# Patient Record
Sex: Female | Born: 1940 | Race: White | Hispanic: No | Smoking: Former smoker
Health system: Southern US, Community
[De-identification: ages and names within clinical notes are randomized; demographics above are authoritative.]

## PROBLEM LIST (undated history)

## (undated) DIAGNOSIS — E079 Disorder of thyroid, unspecified: Secondary | ICD-10-CM

## (undated) DIAGNOSIS — F419 Anxiety disorder, unspecified: Secondary | ICD-10-CM

## (undated) DIAGNOSIS — S72009A Fracture of unspecified part of neck of unspecified femur, initial encounter for closed fracture: Secondary | ICD-10-CM

## (undated) DIAGNOSIS — M1990 Unspecified osteoarthritis, unspecified site: Secondary | ICD-10-CM

## (undated) DIAGNOSIS — M810 Age-related osteoporosis without current pathological fracture: Secondary | ICD-10-CM

## (undated) HISTORY — PX: ANKLE FRACTURE SURGERY: SHX122

## (undated) HISTORY — PX: THYROID SURGERY: SHX805

## (undated) HISTORY — PX: DILATION AND CURETTAGE OF UTERUS: SHX78

---

## 2016-09-20 ENCOUNTER — Emergency Department (HOSPITAL_BASED_OUTPATIENT_CLINIC_OR_DEPARTMENT_OTHER)
Admission: EM | Admit: 2016-09-20 | Discharge: 2016-09-20 | Disposition: A | Discharge status: Home / Self Care | Payer: Medicare Other | Attending: Emergency Medicine | Admitting: Emergency Medicine

## 2016-09-20 ENCOUNTER — Encounter (HOSPITAL_BASED_OUTPATIENT_CLINIC_OR_DEPARTMENT_OTHER): Payer: Self-pay

## 2016-09-20 DIAGNOSIS — Z87891 Personal history of nicotine dependence: Secondary | ICD-10-CM | POA: Insufficient documentation

## 2016-09-20 DIAGNOSIS — W268XXA Contact with other sharp object(s), not elsewhere classified, initial encounter: Secondary | ICD-10-CM | POA: Insufficient documentation

## 2016-09-20 DIAGNOSIS — S6991XA Unspecified injury of right wrist, hand and finger(s), initial encounter: Secondary | ICD-10-CM | POA: Diagnosis present

## 2016-09-20 DIAGNOSIS — Z79899 Other long term (current) drug therapy: Secondary | ICD-10-CM | POA: Insufficient documentation

## 2016-09-20 DIAGNOSIS — Z23 Encounter for immunization: Secondary | ICD-10-CM | POA: Diagnosis not present

## 2016-09-20 DIAGNOSIS — Y999 Unspecified external cause status: Secondary | ICD-10-CM | POA: Insufficient documentation

## 2016-09-20 DIAGNOSIS — S61216A Laceration without foreign body of right little finger without damage to nail, initial encounter: Secondary | ICD-10-CM | POA: Insufficient documentation

## 2016-09-20 DIAGNOSIS — Y929 Unspecified place or not applicable: Secondary | ICD-10-CM | POA: Insufficient documentation

## 2016-09-20 DIAGNOSIS — Y939 Activity, unspecified: Secondary | ICD-10-CM | POA: Insufficient documentation

## 2016-09-20 HISTORY — DX: Age-related osteoporosis without current pathological fracture: M81.0

## 2016-09-20 HISTORY — DX: Disorder of thyroid, unspecified: E07.9

## 2016-09-20 HISTORY — DX: Anxiety disorder, unspecified: F41.9

## 2016-09-20 MED ORDER — TETANUS-DIPHTH-ACELL PERTUSSIS 5-2.5-18.5 LF-MCG/0.5 IM SUSP
0.5000 mL | Freq: Once | INTRAMUSCULAR | Status: AC
  Administered 2016-09-20: 0.5 mL via INTRAMUSCULAR
  Filled 2016-09-20: qty 0.5

## 2016-09-20 NOTE — ED Provider Notes (Signed)
[facility] Provider Note   CSN: [id number] Arrival date & time: 09/20/16  2041   By signing my name below, I, Soijett Blue, attest that this documentation has been prepared under the direction and in the presence of Jaynie Crumbleatyana Isadore Bokhari, [employer] CorporationPA-C Electronically Signed: Soijett Blue, ED Scribe. 09/20/16. 9:24 PM.  History   Chief Complaint Chief Complaint  Patient presents with  . Finger Injury    HPI Julie Hayden is a 76 y.o. female who presents to the Emergency Department complaining of right pinky finger injury occurring PTA. Pt reports associated laceration to right pinky finger and right pinky finger pain. Pt has tried applying pressure without medications with mild relief of her symptoms. She notes that she cut her right pinky finger on the base of her metal lamp. Pt is unsure of the status of her tetanus vaccination. She denies swelling, color change, and any other symptoms.    The history is provided by the patient and the spouse. No language interpreter was used.    Past Medical History:  Diagnosis Date  . Anxiety   . Osteoporosis   . Thyroid disease    goiter    There are no active problems to display for this patient.   Past Surgical History:  Procedure Laterality Date  . ANKLE FRACTURE SURGERY    . CESAREAN SECTION    . THYROID SURGERY      OB History    No data available       Home Medications    Prior to Admission medications   Medication Sig Start Date End Date Taking? Authorizing Provider  cholecalciferol (VITAMIN D) 1000 units tablet Take 4,000 Units by mouth daily.   Yes [provider]  sertraline (ZOLOFT) 50 MG tablet Take 50 mg by mouth daily.   Yes [provider]    Family History No family history on file.  Social History Social History  Substance Use Topics  . Smoking status: Former Smoker    Quit date: 09/21/1978  . Smokeless tobacco: Never Used  . Alcohol use 4.2 oz/week    7 Glasses of wine per week       Allergies   Patient has no known allergies.   Review of Systems Review of Systems  Musculoskeletal: Positive for arthralgias (right pinky finger). Negative for joint swelling.  Skin: Positive for wound (laceration to right pinky finger). Negative for color change.  Neurological: Negative for weakness and numbness.  Hematological: Does not bruise/bleed easily.     Physical Exam Updated Vital Signs BP (!) 162/94   Pulse (!) 117   Temp 99.1 F (37.3 C) (Oral)   Resp (!) 21   Ht 5\' 2"  (1.575 m)   Wt 144 lb (65.3 kg)   SpO2 97%   BMI 26.34 kg/m   Physical Exam  Constitutional: She is oriented to person, place, and time. She appears well-developed and well-nourished. No distress.  HENT:  Head: Normocephalic and atraumatic.  Eyes: EOM are normal.  Neck: Neck supple.  Cardiovascular: Normal rate.   Pulmonary/Chest: Effort normal. No respiratory distress.  Abdominal: She exhibits no distension.  Musculoskeletal: Normal range of motion.  2cm laceration to the ulnar aspect of the right little finger over middle phalanx. Hemostatic. Cap refill <2sec distally. Sensation intact distally. Full ROM and normal strength with flexion and extension at each isolated joint.   Neurological: She is alert and oriented to person, place, and time.  Skin: Skin is warm and dry.  Psychiatric: She has  a normal mood and affect. Her behavior is normal.  Nursing note and vitals reviewed.    ED Treatments / Results  DIAGNOSTIC STUDIES: Oxygen Saturation is 97% on RA, nl by my interpretation.    COORDINATION OF CARE: 9:34 PM Discussed treatment plan with pt at bedside which includes laceration repair, and pt agreed to plan.   Labs (all labs ordered are listed, but only abnormal results are displayed) Labs Reviewed - No data to display  EKG  EKG Interpretation None       Radiology No results found.  Procedures .Marland KitchenLaceration Repair Date/Time: 09/20/2016 10:13 PM Performed by:  Jaynie Crumble Authorized by: Jaynie Crumble   Consent:    Consent obtained:  Verbal   Consent given by:  Patient   Risks discussed:  Pain and need for additional repair Anesthesia (see MAR for exact dosages):    Anesthesia method:  None Laceration details:    Location:  Finger   Finger location:  R small finger   Length (cm):  2 Repair type:    Repair type:  Simple Pre-procedure details:    Preparation:  Patient was prepped and draped in usual sterile fashion Exploration:    Hemostasis achieved with:  Direct pressure   Wound exploration: wound explored through full range of motion     Wound extent: no foreign bodies/material noted     Contaminated: no   Treatment:    Area cleansed with:  Saline   Amount of cleaning:  Standard   Irrigation solution:  Sterile saline   Irrigation method:  Syringe   Visualized foreign bodies/material removed: no   Skin repair:    Repair method:  Tissue adhesive (dermabond) Approximation:    Approximation:  Close Post-procedure details:    Dressing:  Open (no dressing)   Patient tolerance of procedure:  Tolerated well, no immediate complications   (including critical care time)    Medications Ordered in ED Medications - No data to display   Initial Impression / Assessment and Plan / ED Course  I have reviewed the triage vital signs and the nursing notes.  Pertinent labs & imaging results that were available during my care of the patient were reviewed by me and considered in my medical decision making (see chart for details).     Patient in the emergency department with laceration to the right fifth finger, hemostatic at this time, does not appear to involve any tendons, vessels, major nerves. Wound irrigated with saline. Repaired with Dermabond with good closure. Tetanus updated. Initially patient is very anxious, she is tachycardic, hypertensive, vital signs improved with reevaluation. Patient has no other complaints. She is  stable for discharge home at this time, she was given a splint to prevent flexion of the finger due to laceration extending just past the DIP joint. Wound care discussed. Follow-up with family doctor as needed.   Vitals:   09/20/16 2053 09/20/16 2100 09/20/16 2204 09/20/16 2212  BP: (!) 162/94 (!) 162/94 129/70 129/62  Pulse: (!) 117 (!) 117 (!) 104 99  Resp: (!) 21 (!) 21 16 14   Temp: 99.1 F (37.3 C) 99.1 F (37.3 C) 98.4 F (36.9 C)   TempSrc: Oral Oral Oral   SpO2: 97% 97% 96% 95%  Weight:      Height:         Final Clinical Impressions(s) / ED Diagnoses   Final diagnoses:  Laceration of right little finger without foreign body without damage to nail, initial encounter    New  Prescriptions New Prescriptions   No medications on file    I personally performed the services described in this documentation, which was scribed in my presence. The recorded information has been reviewed and is accurate.    Jaynie Crumble, PA-C 09/20/16 2220    Maia Plan, MD 09/21/16 1055

## 2016-09-20 NOTE — ED Triage Notes (Signed)
Pt reports laceration to R pinky finger. Dressing in place on arrival.

## 2016-09-20 NOTE — ED Notes (Signed)
Pt discharged to home with family. NAD.  

## 2016-09-20 NOTE — ED Notes (Signed)
PMS intact before and after. Pt tolerated well. All questions answered. 

## 2016-09-20 NOTE — Discharge Instructions (Signed)
Wound care as discussed in the print out. Keep clean, dry. Use finger splint for the next 3-5 days. Follow up with family doctor as needed.

## 2016-09-20 NOTE — ED Notes (Signed)
ED Provider at bedside. 

## 2017-05-26 ENCOUNTER — Emergency Department (HOSPITAL_COMMUNITY): Payer: Medicare Other

## 2017-05-26 ENCOUNTER — Other Ambulatory Visit: Payer: Self-pay

## 2017-05-26 ENCOUNTER — Inpatient Hospital Stay (HOSPITAL_COMMUNITY)
Admission: EM | Admit: 2017-05-26 | Discharge: 2017-05-31 | DRG: 482 | Disposition: A | Discharge status: Home Health | Payer: Medicare Other | Attending: Internal Medicine | Admitting: Internal Medicine

## 2017-05-26 ENCOUNTER — Encounter (HOSPITAL_COMMUNITY): Payer: Self-pay

## 2017-05-26 DIAGNOSIS — M1612 Unilateral primary osteoarthritis, left hip: Secondary | ICD-10-CM | POA: Diagnosis present

## 2017-05-26 DIAGNOSIS — Z87891 Personal history of nicotine dependence: Secondary | ICD-10-CM | POA: Diagnosis not present

## 2017-05-26 DIAGNOSIS — W010XXA Fall on same level from slipping, tripping and stumbling without subsequent striking against object, initial encounter: Secondary | ICD-10-CM | POA: Diagnosis present

## 2017-05-26 DIAGNOSIS — R000 Tachycardia, unspecified: Secondary | ICD-10-CM | POA: Diagnosis present

## 2017-05-26 DIAGNOSIS — M810 Age-related osteoporosis without current pathological fracture: Secondary | ICD-10-CM | POA: Diagnosis present

## 2017-05-26 DIAGNOSIS — S72009A Fracture of unspecified part of neck of unspecified femur, initial encounter for closed fracture: Secondary | ICD-10-CM | POA: Diagnosis present

## 2017-05-26 DIAGNOSIS — F419 Anxiety disorder, unspecified: Secondary | ICD-10-CM

## 2017-05-26 DIAGNOSIS — Z79899 Other long term (current) drug therapy: Secondary | ICD-10-CM

## 2017-05-26 DIAGNOSIS — E876 Hypokalemia: Secondary | ICD-10-CM | POA: Diagnosis present

## 2017-05-26 DIAGNOSIS — M1990 Unspecified osteoarthritis, unspecified site: Secondary | ICD-10-CM

## 2017-05-26 DIAGNOSIS — Y92512 Supermarket, store or market as the place of occurrence of the external cause: Secondary | ICD-10-CM

## 2017-05-26 DIAGNOSIS — D72829 Elevated white blood cell count, unspecified: Secondary | ICD-10-CM | POA: Diagnosis present

## 2017-05-26 DIAGNOSIS — R52 Pain, unspecified: Secondary | ICD-10-CM

## 2017-05-26 DIAGNOSIS — Z01818 Encounter for other preprocedural examination: Secondary | ICD-10-CM

## 2017-05-26 DIAGNOSIS — Z419 Encounter for procedure for purposes other than remedying health state, unspecified: Secondary | ICD-10-CM

## 2017-05-26 DIAGNOSIS — S72145A Nondisplaced intertrochanteric fracture of left femur, initial encounter for closed fracture: Secondary | ICD-10-CM | POA: Diagnosis present

## 2017-05-26 HISTORY — DX: Fracture of unspecified part of neck of unspecified femur, initial encounter for closed fracture: S72.009A

## 2017-05-26 HISTORY — DX: Unspecified osteoarthritis, unspecified site: M19.90

## 2017-05-26 LAB — BASIC METABOLIC PANEL
Anion gap: 11 (ref 5–15)
BUN: 18 mg/dL (ref 6–20)
CHLORIDE: 106 mmol/L (ref 101–111)
CO2: 25 mmol/L (ref 22–32)
Calcium: 9.3 mg/dL (ref 8.9–10.3)
Creatinine, Ser: 0.68 mg/dL (ref 0.44–1.00)
GFR calc Af Amer: >60 mL/min (ref >60)
GFR calc non Af Amer: >60 mL/min (ref >60)
Glucose, Bld: 128 mg/dL — ABNORMAL HIGH (ref 65–99)
POTASSIUM: 3.4 mmol/L — AB (ref 3.5–5.1)
SODIUM: 142 mmol/L (ref 135–145)

## 2017-05-26 LAB — CBC WITH DIFFERENTIAL/PLATELET
BASOS ABS: 0.1 10*3/uL (ref 0.0–0.1)
Basophils Relative: 0 %
EOS ABS: 0 10*3/uL (ref 0.0–0.7)
Eosinophils Relative: 0 %
HCT: 38.2 % (ref 36.0–46.0)
HEMOGLOBIN: 13.1 g/dL (ref 12.0–15.0)
Lymphocytes Relative: 10 %
Lymphs Abs: 1.7 10*3/uL (ref 0.7–4.0)
MCH: 32 pg (ref 26.0–34.0)
MCHC: 34.3 g/dL (ref 30.0–36.0)
MCV: 93.2 fL (ref 78.0–100.0)
Monocytes Absolute: 0.8 10*3/uL (ref 0.1–1.0)
Monocytes Relative: 5 %
NEUTROS PCT: 85 %
Neutro Abs: 14.7 10*3/uL — ABNORMAL HIGH (ref 1.7–7.7)
Platelets: 406 10*3/uL — ABNORMAL HIGH (ref 150–400)
RBC: 4.1 MIL/uL (ref 3.87–5.11)
RDW: 12.9 % (ref 11.5–15.5)
WBC: 17.3 10*3/uL — AB (ref 4.0–10.5)

## 2017-05-26 MED ORDER — MORPHINE SULFATE (PF) 4 MG/ML IV SOLN
4.0000 mg | Freq: Once | INTRAVENOUS | Status: AC
  Administered 2017-05-26: 4 mg via INTRAVENOUS
  Filled 2017-05-26: qty 1

## 2017-05-26 MED ORDER — ENOXAPARIN SODIUM 40 MG/0.4ML ~~LOC~~ SOLN
40.0000 mg | SUBCUTANEOUS | Status: DC
  Administered 2017-05-27: 10:00:00 40 mg via SUBCUTANEOUS
  Filled 2017-05-26: qty 0.4

## 2017-05-26 MED ORDER — ACETAMINOPHEN 325 MG PO TABS
650.0000 mg | ORAL_TABLET | Freq: Four times a day (QID) | ORAL | Status: DC | PRN
  Administered 2017-05-27 – 2017-05-29 (×3): 650 mg via ORAL
  Filled 2017-05-26 (×3): qty 2

## 2017-05-26 MED ORDER — SODIUM CHLORIDE 0.9% FLUSH
3.0000 mL | Freq: Two times a day (BID) | INTRAVENOUS | Status: DC
  Administered 2017-05-26 – 2017-05-31 (×7): 3 mL via INTRAVENOUS

## 2017-05-26 MED ORDER — ONDANSETRON HCL 4 MG/2ML IJ SOLN: 4.0000 mg | Freq: Four times a day (QID) | INTRAMUSCULAR | Status: DC | PRN

## 2017-05-26 MED ORDER — POLYETHYLENE GLYCOL 3350 17 G PO PACK
17.0000 g | PACK | Freq: Every day | ORAL | Status: DC | PRN
  Administered 2017-05-30: 09:00:00 17 g via ORAL
  Filled 2017-05-26: qty 1

## 2017-05-26 MED ORDER — MORPHINE SULFATE (PF) 2 MG/ML IV SOLN: 2.0000 mg | INTRAVENOUS | Status: DC | PRN

## 2017-05-26 MED ORDER — MORPHINE SULFATE (PF) 2 MG/ML IV SOLN
2.0000 mg | Freq: Once | INTRAVENOUS | Status: AC
  Administered 2017-05-26: 2 mg via INTRAVENOUS
  Filled 2017-05-26: qty 1

## 2017-05-26 MED ORDER — ALPRAZOLAM 0.25 MG PO TABS
0.2500 mg | ORAL_TABLET | Freq: Three times a day (TID) | ORAL | Status: DC | PRN
  Administered 2017-05-26 – 2017-05-27 (×3): 0.25 mg via ORAL
  Filled 2017-05-26 (×3): qty 1

## 2017-05-26 MED ORDER — OXYCODONE HCL 5 MG PO TABS
5.0000 mg | ORAL_TABLET | ORAL | Status: DC | PRN
  Administered 2017-05-26 – 2017-05-27 (×4): 5 mg via ORAL
  Filled 2017-05-26 (×4): qty 1

## 2017-05-26 MED ORDER — SERTRALINE HCL 50 MG PO TABS
50.0000 mg | ORAL_TABLET | Freq: Every day | ORAL | Status: DC
  Administered 2017-05-27 – 2017-05-31 (×5): 50 mg via ORAL
  Filled 2017-05-26 (×6): qty 1

## 2017-05-26 MED ORDER — ONDANSETRON HCL 4 MG PO TABS: 4.0000 mg | ORAL_TABLET | Freq: Four times a day (QID) | ORAL | Status: DC | PRN

## 2017-05-26 MED ORDER — BISACODYL 5 MG PO TBEC: 5.0000 mg | DELAYED_RELEASE_TABLET | Freq: Every day | ORAL | Status: DC | PRN

## 2017-05-26 MED ORDER — ACETAMINOPHEN 650 MG RE SUPP: 650.0000 mg | Freq: Four times a day (QID) | RECTAL | Status: DC | PRN

## 2017-05-26 MED ORDER — VITAMIN D3 25 MCG (1000 UNIT) PO TABS
4000.0000 [IU] | ORAL_TABLET | Freq: Every day | ORAL | Status: DC
  Administered 2017-05-26 – 2017-05-31 (×5): 4000 [IU] via ORAL
  Filled 2017-05-26 (×6): qty 4

## 2017-05-26 NOTE — ED Provider Notes (Signed)
[facility]-EMERGENCY DEPT Provider Note   CSN: [id number] Arrival date & time: 05/26/17  1344     History   Chief Complaint Chief Complaint  Patient presents with  . Fall    HPI Julie Hayden is a 77 y.o. female.  HPI   Patient is a 77 year old female with a history of anxiety and osteoporosis presenting for a mechanical fall that occurred just prior to arrival.  [facility]  Patient denies any preceding dizziness, lightheadedness, or presyncopal sensation.  Patient reports that she went down to her left hip and caught herself.  Patient denies head trauma with this event.  Patient reports her pain is isolated to the anterior left hip and is 8 out of 10 at this time.  Patient denies any numbness distal to the injury in her left hip.  Patient denies any bilateral shoulder pain, elbow pain, wrist pain, knee pain, or ankle pain.  Patient denies any use of blood thinning medications.  Of note, patient is scheduled for a total left hip arthroplasty due to cost you arthritis in the first week of March with Dr. Constance Goltzlen.  Patient has been cleared as of yesterday by her primary care provider in a preoperative evaluation.  Past Medical History:  Diagnosis Date  . Anxiety   . Osteoporosis   . Thyroid disease    goiter    There are no active problems to display for this patient.   Past Surgical History:  Procedure Laterality Date  . ANKLE FRACTURE SURGERY    . CESAREAN SECTION    . THYROID SURGERY      OB History    No data available       Home Medications    Prior to Admission medications   Medication Sig Start Date End Date Taking? Authorizing Provider  CALCIUM PO Take 1 tablet by mouth daily.   Yes [provider]  cholecalciferol (VITAMIN D) 1000 units tablet Take 4,000 Units by mouth daily.   Yes [provider]  Misc Natural Products (TART CHERRY ADVANCED) CAPS Take 2 capsules by mouth daily.   Yes  [provider]  Multiple Vitamin (MULTIVITAMIN WITH MINERALS) TABS tablet Take 1 tablet by mouth daily.   Yes [provider]  sertraline (ZOLOFT) 50 MG tablet Take 50 mg by mouth daily.   Yes [provider]    Family History History reviewed. No pertinent family history.  Social History Social History   Tobacco Use  . Smoking status: Former Smoker    Last attempt to quit: 09/21/1978    Years since quitting: 38.7  . Smokeless tobacco: Never Used  Substance Use Topics  . Alcohol use: Yes    Alcohol/week: 4.2 oz    Types: 7 Glasses of wine per week  . Drug use: No     Allergies   Patient has no known allergies.   Review of Systems Review of Systems  Respiratory: Negative for shortness of breath.   Cardiovascular: Negative for chest pain.  Gastrointestinal: Negative for nausea and vomiting.  Genitourinary: Negative for dysuria.  Musculoskeletal: Positive for arthralgias and joint swelling.  Skin: Negative for color change.  Neurological: Negative for dizziness, syncope, weakness, light-headedness, numbness and headaches.  All other systems reviewed and are negative.    Physical Exam Updated Vital Signs BP 138/75 (BP Location: Left Arm)   Pulse 91   Temp 98.5 F (36.9 C) (Oral)   Resp (!) 22  SpO2 99%   Physical Exam  Constitutional: She appears well-developed and well-nourished. No distress.  Anxious appearing  HENT:  Head: Normocephalic and atraumatic.  Mouth/Throat: Oropharynx is clear and moist.  Eyes: Conjunctivae and EOM are normal. Pupils are equal, round, and reactive to light.  Neck: Normal range of motion. Neck supple.  Cardiovascular: Normal rate, regular rhythm, S1 normal and S2 normal.  No murmur heard. Pulmonary/Chest: Effort normal and breath sounds normal. She has no wheezes. She has no rales.  Abdominal: Soft. She exhibits no distension. There is no tenderness. There is no guarding.  Genitourinary: Guaiac stool:      Musculoskeletal:  Left Hip Exam: Minimal left hip external rotation at rest. No shortening of left hip. There is tenderness to palpation of left anterior hip.  No tenderness palpation of greater trochanter or posterior hip.  No midline lumbar spine pain, but left paraspinal muscular tenderness. No midline C-spine or thoracic spine tenderness.  Patient is able to laterally rotate her neck without difficulty. Bilateral shoulders, elbows, and wrists palpated without ecchymosis, edema, or tenderness.  Bilateral knees palpated  and without ecchymosis, edema, abrasions, or tenderness.  Patient is able to flex and extend bilateral knees.  Bilateral ankles with full range of motion of flexion, extension and without ecchymosis, edema, or point tenderness. Intact, 2+ DP and PT pulses bilaterally. Sensation to light touch intact and equal in distal bilateral lower extremities.  Lymphadenopathy:    She has no cervical adenopathy.  Neurological: She is alert.  Cranial nerves grossly intact. Patient moves extremities symmetrically and with good coordination.  Skin: Skin is warm and dry. No rash noted. No erythema.  Psychiatric: She has a normal mood and affect. Her behavior is normal. Judgment and thought content normal.  Nursing note and vitals reviewed.    ED Treatments / Results  Labs (all labs ordered are listed, but only abnormal results are displayed) Labs Reviewed - No data to display  EKG  EKG Interpretation None       Radiology No results found.  Procedures Procedures (including critical care time)  Medications Ordered in ED Medications  morphine 4 MG/ML injection 4 mg (4 mg Intravenous Given 05/26/17 1518)     Initial Impression / Assessment and Plan / ED Course  I have reviewed the triage vital signs and the nursing notes.  Pertinent labs & imaging results that were available during my care of the patient were reviewed by me and considered in my medical decision making  (see chart for details).     Patient is nontoxic-appearing but anxious and in pain in the anterior left hip.  Will order x-rays of lumbar spine, left hip with pelvis, and left knee.  Morphine for pain control.  4:25 PM X-ray demonstrating nondisplaced intertrochanteric fracture of the left hip.  X-ray of the lumbar spine and left knee without abnormality for acute findings.  Spoke with Freddie Breech, PA-C, who is on-call for Dr. Charlann Boxer and patient is to be admitted to the hospital and orthopedics will consult tomorrow.  Additional 2 mg of morphine ordered for pain control.  4:40 PM Spoke with Dr. Clearnce Sorrel of [facility] who will admit the patient.  Final Clinical Impressions(s) / ED Diagnoses   Final diagnoses:  Closed nondisplaced intertrochanteric fracture of left femur, initial encounter [facility])    ED Discharge Orders    None       Delia Chimes 05/26/17 1849    Donnetta Hutching, MD 05/27/17 [phone]

## 2017-05-26 NOTE — ED Notes (Signed)
Called report to 6th floor , RN stated that she was getting a surgical, RN made aware pt would be brought up in 15 mins

## 2017-05-26 NOTE — ED Triage Notes (Signed)
EMS v/s 137/69 HR 93, RR 20 CBG 181

## 2017-05-26 NOTE — ED Triage Notes (Signed)
Pt is arrived via EMS, [facility]. Pt is c/o left hip pain. Pt is schedule for hip replacement  On March 5th . Pt denies LOC, and any other injuries.

## 2017-05-26 NOTE — H&P (Signed)
History and Physical    Mercie Balsley YNW:[id number] DOB: [birth year] DOA: 05/26/2017  PCP: Cheron Schaumann., MD  Patient coming from: home    Chief Complaint: fall, fracture  HPI: Hildy Nicholl is a 77 y.o. female with medical history significant of osteoarthritis and anxiety who comes in after mechanical fall and found to have nondisplaced intertrochanteric fracture of left proximal femur.  Patient reports that she was shopping in a grocery store and slipped on a tomato on the ground and landed on her right hip.  She denies any loss of consciousness, chest pain, abdominal pain, syncope, nausea vomiting, diaphoresis, palpitations, presyncope, lower some edema, orthopnea.  Of note patient was set to have elective total hip replacement in middle of March.  ED Course: In the ED vitals were unremarkableOther than mild tachycardia to 101.  Labs are pending x-ray of left hip showed intertrochanteric fracture of left proximal femur.  Orthopedics was consulted and recommended admission to general medicine with orthopedics following.  Review of Systems: As per HPI otherwise 10 point review of systems negative.     Past Medical History:  Diagnosis Date  . Anxiety   . Hip fracture, unspecified laterality, closed, initial encounter (HCC) 05/26/2017  . Osteoarthritis 05/26/2017  . Osteoporosis   . Thyroid disease    goiter    Past Surgical History:  Procedure Laterality Date  . ANKLE FRACTURE SURGERY    . CESAREAN SECTION    . THYROID SURGERY       reports that she quit smoking about 38 years ago. she has never used smokeless tobacco. She reports that she drinks about 4.2 oz of alcohol per week. She reports that she does not use drugs.  No Known Allergies  History reviewed. No pertinent family history. Unacceptable: Noncontributory, unremarkable, or negative. Acceptable: Family history reviewed and not pertinent (If you reviewed it)  Prior to Admission medications   Medication Sig  Start Date End Date Taking? Authorizing Provider  CALCIUM PO Take 1 tablet by mouth daily.   Yes [provider]  cholecalciferol (VITAMIN D) 1000 units tablet Take 4,000 Units by mouth daily.   Yes [provider]  Misc Natural Products (TART CHERRY ADVANCED) CAPS Take 2 capsules by mouth daily.   Yes [provider]  Multiple Vitamin (MULTIVITAMIN WITH MINERALS) TABS tablet Take 1 tablet by mouth daily.   Yes [provider]  sertraline (ZOLOFT) 50 MG tablet Take 50 mg by mouth daily.   Yes [provider]    Physical Exam: Vitals:   05/26/17 1406 05/26/17 1614  BP: 138/75 124/69  Pulse: 91 (!) 101  Resp: (!) 22 18  Temp: 98.5 F (36.9 C)   TempSrc: Oral   SpO2: 99% 99%    Constitutional: NAD, calm, comfortable Vitals:   05/26/17 1406 05/26/17 1614  BP: 138/75 124/69  Pulse: 91 (!) 101  Resp: (!) 22 18  Temp: 98.5 F (36.9 C)   TempSrc: Oral   SpO2: 99% 99%   Eyes: Anicteric ENMT: Moist mucous membranes Neck: normal, well-healed incision and neck Respiratory: clear to auscultation bilaterally, no wheezing, no crackles. Normal respiratory effort. No accessory muscle use.  Cardiovascular: 1 out of 6 systolic murmur heard best at right upper sternal border Abdomen: no tenderness, no masses palpated. No hepatosplenomegaly. Bowel sounds positive.  Musculoskeletal: Left lower extremity is extremely tender to any manipulation.  Noted to have intact distal pulses and good cap refill.  Able to wiggle toes and flex knee somewhat Skin:  no rashes on visible skin Neurologic: Grossly intact Psychiatric: Normal judgment and insight. Alert and oriented x 3. Normal mood.   (Anything < 9 systems with 2 bullets each down codes to level 1) (If patient refuses exam can't bill higher level) (Make sure to document decubitus ulcers present on admission -- if possible -- and whether patient has chronic indwelling catheter at time of admission)  Labs  on Admission: I have personally reviewed following labs and imaging studies  CBC: No results for input(s): WBC, NEUTROABS, HGB, HCT, MCV, PLT in the last 168 hours. Basic Metabolic Panel: No results for input(s): NA, K, CL, CO2, GLUCOSE, BUN, CREATININE, CALCIUM, MG, PHOS in the last 168 hours. GFR: CrCl cannot be calculated (No order found.). Liver Function Tests: No results for input(s): AST, ALT, ALKPHOS, BILITOT, PROT, ALBUMIN in the last 168 hours. No results for input(s): LIPASE, AMYLASE in the last 168 hours. No results for input(s): AMMONIA in the last 168 hours. Coagulation Profile: No results for input(s): INR, PROTIME in the last 168 hours. Cardiac Enzymes: No results for input(s): CKTOTAL, CKMB, CKMBINDEX, TROPONINI in the last 168 hours. BNP (last 3 results) No results for input(s): PROBNP in the last 8760 hours. HbA1C: No results for input(s): HGBA1C in the last 72 hours. CBG: No results for input(s): GLUCAP in the last 168 hours. Lipid Profile: No results for input(s): CHOL, HDL, LDLCALC, TRIG, CHOLHDL, LDLDIRECT in the last 72 hours. Thyroid Function Tests: No results for input(s): TSH, T4TOTAL, FREET4, T3FREE, THYROIDAB in the last 72 hours. Anemia Panel: No results for input(s): VITAMINB12, FOLATE, FERRITIN, TIBC, IRON, RETICCTPCT in the last 72 hours. Urine analysis: No results found for: COLORURINE, APPEARANCEUR, LABSPEC, PHURINE, GLUCOSEU, HGBUR, BILIRUBINUR, KETONESUR, PROTEINUR, UROBILINOGEN, NITRITE, LEUKOCYTESUR  Radiological Exams on Admission: Dg Lumbar Spine 2-3 Views  Result Date: 05/26/2017 CLINICAL DATA:  Status post fall, left hip pain, back pain EXAM: LUMBAR SPINE - 2-3 VIEW COMPARISON:  None. FINDINGS: There are 5 nonrib bearing lumbar-type vertebral bodies. The vertebral body heights are maintained. There is generalized osteopenia. The alignment is anatomic. There is no static listhesis. There is no spondylolysis. There is no acute fracture. The  disc spaces are relatively well maintained. There is bilateral facet arthropathy at L5-S1. The SI joints are unremarkable. IMPRESSION: No acute osseous injury of the lumbar spine. Electronically Signed   By: Elige Ko   On: 05/26/2017 16:07   Dg Knee Complete 4 Views Left  Result Date: 05/26/2017 CLINICAL DATA:  Fall today; diffuse left hip pain; diffuse left knee pain; pain across lower back EXAM: LEFT KNEE - COMPLETE 4+ VIEW COMPARISON:  None. FINDINGS: No fracture of the proximal tibia or distal femur. Patella is normal. No joint effusion. IMPRESSION: No fracture or dislocation. Electronically Signed   By: Genevive Bi M.D.   On: 05/26/2017 16:08   Dg Hip Unilat W Or Wo Pelvis 2-3 Views Left  Result Date: 05/26/2017 CLINICAL DATA:  Left hip pain after fall. EXAM: DG HIP (WITH OR WITHOUT PELVIS) 2-3V LEFT COMPARISON:  None. FINDINGS: There is an acute, essentially nondisplaced intertrochanteric fracture of the left proximal femur, with slight posterior angulation. No dislocation. Severe left hip superior joint space narrowing. The right hip joint is unremarkable. Diffuse osteopenia. The pubic symphysis and sacroiliac joints are intact. IMPRESSION: 1. Acute, essentially nondisplaced intertrochanteric fracture of the left proximal femur. 2. Severe left hip osteoarthritis. Electronically Signed   By: Obie Dredge M.D.   On: 05/26/2017 16:05  EKG: Independently reviewed.  Unremarkable  Assessment/Plan Principal Problem:   Hip fracture, unspecified laterality, closed, initial encounter (HCC) Active Problems:   Osteoarthritis   Anxiety    ##) Nondisplaced intertrochanteric fracture of left proximal femur: -Pain control with p.o. acetaminophen, oxycodone -IV morphine every 3 hours as needed -Orthopedics consult -N.p.o. at midnight  ##)anxiety: -Continues sertraline 50 mg daily - As needed alprazolam 0.25 mg 3 times daily as needed  Fluids: Tolerating p.o. Elect lites:  Pending Nutrition: Regular diet, n.p.o. at midnight  Prophylaxis: Enoxaparin  Disposition: We will have PT evaluate the patient, likely home health PT   Full code  Delaine LameShrey C Duru Reiger MD [facility]   If 7PM-7AM, please contact night-coverage [email or web address] Password [email or web address]  05/26/2017, 4:58 PM

## 2017-05-27 ENCOUNTER — Inpatient Hospital Stay (HOSPITAL_COMMUNITY): Payer: Medicare Other

## 2017-05-27 DIAGNOSIS — S72009A Fracture of unspecified part of neck of unspecified femur, initial encounter for closed fracture: Secondary | ICD-10-CM

## 2017-05-27 LAB — SURGICAL PCR SCREEN
MRSA, PCR: NEGATIVE
Staphylococcus aureus: NEGATIVE

## 2017-05-27 LAB — URINALYSIS, ROUTINE W REFLEX MICROSCOPIC
Bilirubin Urine: NEGATIVE
GLUCOSE, UA: NEGATIVE mg/dL
Hgb urine dipstick: NEGATIVE
KETONES UR: NEGATIVE mg/dL
LEUKOCYTES UA: NEGATIVE
NITRITE: NEGATIVE
PH: 6 (ref 5.0–8.0)
Protein, ur: NEGATIVE mg/dL
SPECIFIC GRAVITY, URINE: 1.01 (ref 1.005–1.030)

## 2017-05-27 MED ORDER — SODIUM CHLORIDE 0.9 % IV SOLN
INTRAVENOUS | Status: DC
  Administered 2017-05-27: 23:00:00 via INTRAVENOUS

## 2017-05-27 MED ORDER — POTASSIUM CHLORIDE CRYS ER 20 MEQ PO TBCR
40.0000 meq | EXTENDED_RELEASE_TABLET | Freq: Once | ORAL | Status: AC
  Administered 2017-05-27: 12:00:00 40 meq via ORAL
  Filled 2017-05-27: qty 2

## 2017-05-27 MED ORDER — ENOXAPARIN SODIUM 40 MG/0.4ML ~~LOC~~ SOLN: 40.0000 mg | Freq: Once | SUBCUTANEOUS | Status: DC

## 2017-05-27 NOTE — Progress Notes (Signed)
Patient ID: Julie Hayden Rogel, female   DOB: 01/04/1941, 77 y.o.   MRN: [id number]    PROGRESS NOTE  Julie Hayden Dier  [id number] DOB: 03/07/1941 DOA: 05/26/2017  PCP: Cheron SchaumannVelazquez, Gretchen Y., MD   Brief Narrative:  Pt is 77 yo female who presented after an episode of mechanical fall and has sustained left hip fracture.   Assessment & Plan:   Principal Problem:   Hip fracture, left intertrochanteric hip fracture - pt reports pain is controlled on current pain medication - plan for IM nail tomorrow afternoon - NPO after midnight - continue analgesia as needed  - appreciate ortho team assistance  - will obtain pre op CXR and UA  Active Problems:   Leukocytosis - suspect reactive process from acute fractures - no fevers, no clear evidence of an infectious etiology at this time but not clear if left groin pain from actual hip fracture vs early cystitis  - will obtain CXR and UA as part of the pre op eval - CBC in AM    Anxiety - stable     Hypokalemia - supplement and repeat BMP In AM  DVT prophylaxis: SCD's Code Status: Full  Family Communication: Patient at bedside  Disposition Plan: to be determined   Consultants:   Ortho   Procedures:   None  Antimicrobials:   None  Subjective: Still with pain in the left groin and hip area.   Objective: Vitals:   05/26/17 2100 05/26/17 2127 05/27/17 0557 05/27/17 0728  BP: 122/74  128/72 (!) 141/70  Pulse: 76  85 83  Resp: 15  16   Temp: 97.9 F (36.6 C)  99.4 F (37.4 C) 99.5 F (37.5 C)  TempSrc: Axillary  Oral Oral  SpO2: 99%  96% 96%  Weight:  65.9 kg (145 lb 5 oz)    Height:  5\' 2"  (1.575 m)      Intake/Output Summary (Last 24 hours) at 05/27/2017 1107 Last data filed at 05/27/2017 [id number] Gross per 24 hour  Intake 240 ml  Output 200 ml  Net 40 ml   Filed Weights   05/26/17 2127  Weight: 65.9 kg (145 lb 5 oz)    Examination:  General exam: Appears calm and comfortable  Respiratory system: Clear to  auscultation. Respiratory effort normal. Cardiovascular system: S1 & S2 heard, RRR. No JVD, murmurs, rubs, gallops or clicks. No pedal edema. Gastrointestinal system: Abdomen is nondistended, soft and nontender. No organomegaly or masses felt. Normal bowel sounds heard. Central nervous system: Alert and oriented. No focal neurological deficits. Extremities: Symmetric 5 x 5 power. TTP in the left hip area Skin: No rashes, lesions or ulcers Psychiatry: Judgement and insight appear normal. Mood & affect appropriate.   Data Reviewed: I have personally reviewed following labs and imaging studies  CBC: Recent Labs  Lab 05/26/17 1658  WBC 17.3*  NEUTROABS 14.7*  HGB 13.1  HCT 38.2  MCV 93.2  PLT 406*   Basic Metabolic Panel: Recent Labs  Lab 05/26/17 1658  NA 142  K 3.4*  CL 106  CO2 25  GLUCOSE 128*  BUN 18  CREATININE 0.68  CALCIUM 9.3    Radiology Studies: Dg Lumbar Spine 2-3 Views Result Date: 05/26/2017 No acute osseous injury of the lumbar spine.   Dg Knee Complete 4 Views Left Result Date: 05/26/2017 No fracture or dislocation.   Dg Hip Unilat W Or Wo Pelvis 2-3 Views Left Result Date: 05/26/2017 1. Acute, essentially nondisplaced intertrochanteric fracture of the left proximal femur. 2.  Severe left hip osteoarthritis.  Scheduled Meds: . cholecalciferol  4,000 Units Oral Daily  . sertraline  50 mg Oral Daily  . sodium chloride flush  3 mL Intravenous Q12H     LOS: 1 day   Time spent: 25 minutes  25 minutes with > 50% of time discussing current diagnostic test results, clinical impression and plan of care.  Debbora Presto, MD [facility] Pager [phone]  If 7PM-7AM, please contact night-coverage [email or web address] Password [facility] 05/27/2017, 11:07 AM

## 2017-05-27 NOTE — Consult Note (Signed)
Reason for Consult:  Left intertrochanteric hip fracture  Referring Physician: ED provider  Julie Hayden is an 77 y.o. female.  HPI: Julie Hayden is a 77 y.o. female with medical history significant of osteoarthritis and anxiety who comes in after mechanical fall and found to have nondisplaced intertrochanteric fracture of left proximal femur.  Patient reports that she was shopping in a grocery store and slipped on a tomato on the ground and landed on her hip.  She denies any loss of consciousness, chest pain, abdominal pain, syncope, nausea vomiting, diaphoresis, palpitations, presyncope, lower some edema, orthopnea. Patient was on the schedule with Dr. Alvan Dame to have a left total hip arthroplasty on 06/15/2017.  Dr. Alvan Dame discussed with the patient and daughter that unfortunately due to where the fracture is located we would need to fix the femur in a way that we wouldn't be able to place a THA.  We will have to fix the hip, allow it to heal and at a later date convert it to a THA.  Patient and family understand and want to proceed with surgery.    Past Medical History:  Diagnosis Date  . Anxiety   . Hip fracture, unspecified laterality, closed, initial encounter ([place]) 05/26/2017  . Osteoarthritis 05/26/2017  . Osteoporosis   . Thyroid disease    goiter    Past Surgical History:  Procedure Laterality Date  . ANKLE FRACTURE SURGERY    . CESAREAN SECTION    . THYROID SURGERY      History reviewed. No pertinent family history.  Social History:  reports that she quit smoking about 38 years ago. she has never used smokeless tobacco. She reports that she drinks about 4.2 oz of alcohol per week. She reports that she does not use drugs.  Allergies: No Known Allergies    Results for orders placed or performed during the hospital encounter of 05/26/17 (from the past 48 hour(s))  CBC with Differential     Status: Abnormal   Collection Time: 05/26/17  4:58 PM  Result Value Ref Range   WBC 17.3  (H) 4.0 - 10.5 K/uL   RBC 4.10 3.87 - 5.11 MIL/uL   Hemoglobin 13.1 12.0 - 15.0 g/dL   HCT 38.2 36.0 - 46.0 %   MCV 93.2 78.0 - 100.0 fL   MCH 32.0 26.0 - 34.0 pg   MCHC 34.3 30.0 - 36.0 g/dL   RDW 12.9 11.5 - 15.5 %   Platelets 406 (H) 150 - 400 K/uL   Neutrophils Relative % 85 %   Neutro Abs 14.7 (H) 1.7 - 7.7 K/uL   Lymphocytes Relative 10 %   Lymphs Abs 1.7 0.7 - 4.0 K/uL   Monocytes Relative 5 %   Monocytes Absolute 0.8 0.1 - 1.0 K/uL   Eosinophils Relative 0 %   Eosinophils Absolute 0.0 0.0 - 0.7 K/uL   Basophils Relative 0 %   Basophils Absolute 0.1 0.0 - 0.1 K/uL    Comment: Performed at [facility], [place] [address], [place], [place] [address]  Basic metabolic panel     Status: Abnormal   Collection Time: 05/26/17  4:58 PM  Result Value Ref Range   Sodium 142 135 - 145 mmol/L   Potassium 3.4 (L) 3.5 - 5.1 mmol/L   Chloride 106 101 - 111 mmol/L   CO2 25 22 - 32 mmol/L   Glucose, Bld 128 (H) 65 - 99 mg/dL   BUN 18 6 - 20 mg/dL   Creatinine, Ser 0.68  0.44 - 1.00 mg/dL   Calcium 9.3 8.9 - 10.3 mg/dL   GFR calc non Af Amer >60 >60 mL/min   GFR calc Af Amer >60 >60 mL/min    Comment: (NOTE) The eGFR has been calculated using the CKD EPI equation. This calculation has not been validated in all clinical situations. eGFR's persistently <60 mL/min signify possible Chronic Kidney Disease.    Anion gap 11 5 - 15    Comment: Performed at [facility], [place] [address], [place], [place] [address]    Dg Lumbar Spine 2-3 Views  Result Date: 05/26/2017 CLINICAL DATA:  Status post fall, left hip pain, back pain EXAM: LUMBAR SPINE - 2-3 VIEW COMPARISON:  None. FINDINGS: There are 5 nonrib bearing lumbar-type vertebral bodies. The vertebral body heights are maintained. There is generalized osteopenia. The alignment is anatomic. There is no static listhesis. There is no spondylolysis. There is no acute fracture. The disc spaces are relatively well  maintained. There is bilateral facet arthropathy at L5-S1. The SI joints are unremarkable. IMPRESSION: No acute osseous injury of the lumbar spine. Electronically Signed   By: Kathreen Devoid   On: 05/26/2017 16:07   Dg Knee Complete 4 Views Left  Result Date: 05/26/2017 CLINICAL DATA:  Fall today; diffuse left hip pain; diffuse left knee pain; pain across lower back EXAM: LEFT KNEE - COMPLETE 4+ VIEW COMPARISON:  None. FINDINGS: No fracture of the proximal tibia or distal femur. Patella is normal. No joint effusion. IMPRESSION: No fracture or dislocation. Electronically Signed   By: Suzy Bouchard M.D.   On: 05/26/2017 16:08   Dg Hip Unilat W Or Wo Pelvis 2-3 Views Left  Result Date: 05/26/2017 CLINICAL DATA:  Left hip pain after fall. EXAM: DG HIP (WITH OR WITHOUT PELVIS) 2-3V LEFT COMPARISON:  None. FINDINGS: There is an acute, essentially nondisplaced intertrochanteric fracture of the left proximal femur, with slight posterior angulation. No dislocation. Severe left hip superior joint space narrowing. The right hip joint is unremarkable. Diffuse osteopenia. The pubic symphysis and sacroiliac joints are intact. IMPRESSION: 1. Acute, essentially nondisplaced intertrochanteric fracture of the left proximal femur. 2. Severe left hip osteoarthritis. Electronically Signed   By: Titus Dubin M.D.   On: 05/26/2017 16:05    Review of Systems  Constitutional: Negative.   HENT: Negative.   Eyes: Negative.   Respiratory: Negative.   Cardiovascular: Negative.   Gastrointestinal: Negative.   Genitourinary: Negative.   Musculoskeletal: Positive for joint pain.  Skin: Negative.   Neurological: Negative.   Endo/Heme/Allergies: Negative.   Psychiatric/Behavioral: The patient is nervous/anxious.    Blood pressure (!) 141/70, pulse 83, temperature 99.5 F (37.5 C), temperature source Oral, resp. rate 16, height 5' 2"  (1.575 m), weight 65.9 kg (145 lb 5 oz), SpO2 96 %. Physical Exam  Constitutional: She  is oriented to person, place, and time. She appears well-developed.  HENT:  Head: Normocephalic.  Eyes: Pupils are equal, round, and reactive to light.  Neck: Neck supple. No JVD present. No tracheal deviation present. No thyromegaly present.  Cardiovascular: Normal rate, regular rhythm and intact distal pulses.  Respiratory: Effort normal and breath sounds normal. No respiratory distress. She has no wheezes.  GI: Soft. There is no tenderness. There is no guarding.  Musculoskeletal:       Left hip: She exhibits decreased range of motion, decreased strength, tenderness and bony tenderness.  Lymphadenopathy:    She has no cervical adenopathy.  Neurological: She is alert and oriented to  person, place, and time.  Skin: Skin is warm and dry.  Psychiatric: She has a normal mood and affect.    Assessment/Plan: Left intertrochanteric hip fracture   NPO after midnight  Plan on IM nail tomorrow afternoon  Patient has already received Lovenox today and this will be help until after surgery  Peri-operative orders were placed by Dr. Alvan Dame  Consent order also placed  Lucille Passy [facility] 05/27/2017, 10:38 AM

## 2017-05-27 NOTE — Progress Notes (Addendum)
PT Cancellation Note  Patient Details Name: Julie Hayden MRN: [id number] DOB: 11/20/1940   Cancelled Treatment:    Reason Eval/Treat Not Completed: Active bedrest order(also awaiting ortho consult) Will need weight bearing status prior to evaluation as well.  1200 -  IM nail planned for tomorrow.  Please re-order after surgery.   Jade Burright,KATHrine E 05/27/2017, 8:30 AM Zenovia JarredKati Braheem Tomasik, PT, DPT 05/27/2017 Pager: [phone]

## 2017-05-28 ENCOUNTER — Encounter (HOSPITAL_COMMUNITY): Payer: Self-pay

## 2017-05-28 ENCOUNTER — Inpatient Hospital Stay (HOSPITAL_COMMUNITY): Payer: Medicare Other

## 2017-05-28 ENCOUNTER — Inpatient Hospital Stay (HOSPITAL_COMMUNITY): Payer: Medicare Other | Admitting: Anesthesiology

## 2017-05-28 ENCOUNTER — Encounter (HOSPITAL_COMMUNITY)
Admission: EM | Disposition: A | Discharge status: Home Health | Payer: Self-pay | Source: Home / Self Care | Attending: Internal Medicine

## 2017-05-28 HISTORY — PX: FEMUR IM NAIL: SHX1597

## 2017-05-28 LAB — CBC
HCT: 37.6 % (ref 36.0–46.0)
Hemoglobin: 12.6 g/dL (ref 12.0–15.0)
MCH: 31.5 pg (ref 26.0–34.0)
MCHC: 33.5 g/dL (ref 30.0–36.0)
MCV: 94 fL (ref 78.0–100.0)
PLATELETS: 341 10*3/uL (ref 150–400)
RBC: 4 MIL/uL (ref 3.87–5.11)
RDW: 12.9 % (ref 11.5–15.5)
WBC: 9.9 10*3/uL (ref 4.0–10.5)

## 2017-05-28 LAB — BASIC METABOLIC PANEL
ANION GAP: 10 (ref 5–15)
BUN: 10 mg/dL (ref 6–20)
CALCIUM: 9 mg/dL (ref 8.9–10.3)
CO2: 25 mmol/L (ref 22–32)
CREATININE: 0.65 mg/dL (ref 0.44–1.00)
Chloride: 104 mmol/L (ref 101–111)
GFR calc Af Amer: >60 mL/min (ref >60)
GLUCOSE: 104 mg/dL — AB (ref 65–99)
Potassium: 3.9 mmol/L (ref 3.5–5.1)
Sodium: 139 mmol/L (ref 135–145)

## 2017-05-28 SURGERY — INSERTION, INTRAMEDULLARY ROD, FEMUR
Anesthesia: General | Laterality: Left

## 2017-05-28 MED ORDER — CEFAZOLIN SODIUM-DEXTROSE 2-4 GM/100ML-% IV SOLN
INTRAVENOUS | Status: AC
  Filled 2017-05-28: qty 100

## 2017-05-28 MED ORDER — FENTANYL CITRATE (PF) 100 MCG/2ML IJ SOLN
INTRAMUSCULAR | Status: AC
  Filled 2017-05-28: qty 2

## 2017-05-28 MED ORDER — METOCLOPRAMIDE HCL 5 MG PO TABS: 5.0000 mg | ORAL_TABLET | Freq: Three times a day (TID) | ORAL | Status: DC | PRN

## 2017-05-28 MED ORDER — FERROUS SULFATE 325 (65 FE) MG PO TABS
325.0000 mg | ORAL_TABLET | Freq: Three times a day (TID) | ORAL | Status: DC
  Administered 2017-05-29 – 2017-05-31 (×6): 325 mg via ORAL
  Filled 2017-05-28 (×6): qty 1

## 2017-05-28 MED ORDER — HYDROMORPHONE HCL 1 MG/ML IJ SOLN
INTRAMUSCULAR | Status: AC
  Filled 2017-05-28: qty 1

## 2017-05-28 MED ORDER — FENTANYL CITRATE (PF) 100 MCG/2ML IJ SOLN
25.0000 ug | INTRAMUSCULAR | Status: DC | PRN
  Administered 2017-05-28: 50 ug via INTRAVENOUS
  Administered 2017-05-28 (×2): 25 ug via INTRAVENOUS

## 2017-05-28 MED ORDER — ONDANSETRON HCL 4 MG/2ML IJ SOLN
INTRAMUSCULAR | Status: DC | PRN
  Administered 2017-05-28: 4 mg via INTRAVENOUS

## 2017-05-28 MED ORDER — CHLORHEXIDINE GLUCONATE 4 % EX LIQD: 60.0000 mL | Freq: Once | CUTANEOUS | Status: DC

## 2017-05-28 MED ORDER — LIDOCAINE 2% (20 MG/ML) 5 ML SYRINGE
INTRAMUSCULAR | Status: DC | PRN
  Administered 2017-05-28: 50 mg via INTRAVENOUS

## 2017-05-28 MED ORDER — POVIDONE-IODINE 10 % EX SWAB: 2.0000 "application " | Freq: Once | CUTANEOUS | Status: DC

## 2017-05-28 MED ORDER — HYDROMORPHONE HCL 1 MG/ML IJ SOLN
0.2500 mg | INTRAMUSCULAR | Status: DC | PRN
  Administered 2017-05-28 (×4): 0.5 mg via INTRAVENOUS

## 2017-05-28 MED ORDER — ENOXAPARIN SODIUM 40 MG/0.4ML ~~LOC~~ SOLN
40.0000 mg | SUBCUTANEOUS | Status: DC
Start: 2017-05-29 — End: 2017-05-31
  Administered 2017-05-29 – 2017-05-31 (×3): 40 mg via SUBCUTANEOUS
  Filled 2017-05-28 (×3): qty 0.4

## 2017-05-28 MED ORDER — PROPOFOL 10 MG/ML IV BOLUS
INTRAVENOUS | Status: AC
  Filled 2017-05-28: qty 20

## 2017-05-28 MED ORDER — LACTATED RINGERS IV SOLN
INTRAVENOUS | Status: DC
  Administered 2017-05-28 (×2): via INTRAVENOUS

## 2017-05-28 MED ORDER — DEXAMETHASONE SODIUM PHOSPHATE 10 MG/ML IJ SOLN
8.0000 mg | Freq: Once | INTRAMUSCULAR | Status: AC
  Administered 2017-05-28: 8 mg via INTRAVENOUS
  Filled 2017-05-28: qty 1

## 2017-05-28 MED ORDER — FENTANYL CITRATE (PF) 100 MCG/2ML IJ SOLN
INTRAMUSCULAR | Status: DC | PRN
  Administered 2017-05-28 (×4): 25 ug via INTRAVENOUS

## 2017-05-28 MED ORDER — MEPERIDINE HCL 50 MG/ML IJ SOLN
6.2500 mg | INTRAMUSCULAR | Status: DC | PRN
Start: 2017-05-28 — End: 2017-05-28

## 2017-05-28 MED ORDER — CEFAZOLIN SODIUM-DEXTROSE 2-4 GM/100ML-% IV SOLN
2.0000 g | Freq: Four times a day (QID) | INTRAVENOUS | Status: AC
  Administered 2017-05-28 – 2017-05-29 (×2): 2 g via INTRAVENOUS
  Filled 2017-05-28 (×2): qty 100

## 2017-05-28 MED ORDER — METHOCARBAMOL 1000 MG/10ML IJ SOLN
500.0000 mg | Freq: Four times a day (QID) | INTRAVENOUS | Status: DC | PRN
  Administered 2017-05-28: 500 mg via INTRAVENOUS
  Filled 2017-05-28: qty 550

## 2017-05-28 MED ORDER — ASPIRIN EC 325 MG PO TBEC: 325.0000 mg | DELAYED_RELEASE_TABLET | Freq: Two times a day (BID) | ORAL | Status: DC

## 2017-05-28 MED ORDER — LORAZEPAM 2 MG/ML IJ SOLN: 0.5000 mg | Freq: Four times a day (QID) | INTRAMUSCULAR | Status: DC | PRN

## 2017-05-28 MED ORDER — OXYCODONE HCL 5 MG PO TABS
5.0000 mg | ORAL_TABLET | ORAL | Status: DC | PRN
  Administered 2017-05-28 – 2017-05-30 (×5): 10 mg via ORAL
  Administered 2017-05-30: 5 mg via ORAL
  Administered 2017-05-30: 10 mg via ORAL
  Administered 2017-05-31 (×2): 5 mg via ORAL
  Filled 2017-05-28 (×6): qty 2
  Filled 2017-05-28: qty 1
  Filled 2017-05-28 (×2): qty 2

## 2017-05-28 MED ORDER — METHOCARBAMOL 500 MG PO TABS
500.0000 mg | ORAL_TABLET | Freq: Four times a day (QID) | ORAL | Status: DC | PRN
  Administered 2017-05-29 – 2017-05-31 (×5): 500 mg via ORAL
  Filled 2017-05-28 (×5): qty 1

## 2017-05-28 MED ORDER — MEPERIDINE HCL 50 MG/ML IJ SOLN: 6.2500 mg | INTRAMUSCULAR | Status: DC | PRN

## 2017-05-28 MED ORDER — MORPHINE SULFATE (PF) 2 MG/ML IV SOLN
0.5000 mg | INTRAVENOUS | Status: DC | PRN
  Administered 2017-05-28: 0.5 mg via INTRAVENOUS
  Filled 2017-05-28: qty 1

## 2017-05-28 MED ORDER — METOCLOPRAMIDE HCL 5 MG/ML IJ SOLN: 5.0000 mg | Freq: Three times a day (TID) | INTRAMUSCULAR | Status: DC | PRN

## 2017-05-28 MED ORDER — CEFAZOLIN SODIUM-DEXTROSE 2-4 GM/100ML-% IV SOLN
2.0000 g | INTRAVENOUS | Status: AC
  Administered 2017-05-28: 2 g via INTRAVENOUS

## 2017-05-28 MED ORDER — FENTANYL CITRATE (PF) 100 MCG/2ML IJ SOLN
25.0000 ug | INTRAMUSCULAR | Status: DC | PRN
Start: 2017-05-28 — End: 2017-05-28

## 2017-05-28 MED ORDER — PROPOFOL 10 MG/ML IV BOLUS
INTRAVENOUS | Status: DC | PRN
  Administered 2017-05-28: 150 mg via INTRAVENOUS

## 2017-05-28 MED ORDER — MENTHOL 3 MG MT LOZG: 1.0000 | LOZENGE | OROMUCOSAL | Status: DC | PRN

## 2017-05-28 MED ORDER — PHENOL 1.4 % MT LIQD: 1.0000 | OROMUCOSAL | Status: DC | PRN

## 2017-05-28 SURGICAL SUPPLY — 39 items
BAG ZIPLOCK 12X15 (MISCELLANEOUS) IMPLANT
BIT DRILL CANN LG 4.3MM (BIT) ×1 IMPLANT
BLADE SAW SGTL 11.0X1.19X90.0M (BLADE) IMPLANT
BNDG GAUZE ELAST 4 BULKY (GAUZE/BANDAGES/DRESSINGS) ×3 IMPLANT
COVER PERINEAL POST (MISCELLANEOUS) ×3 IMPLANT
COVER SURGICAL LIGHT HANDLE (MISCELLANEOUS) ×3 IMPLANT
DERMABOND ADVANCED (GAUZE/BANDAGES/DRESSINGS) ×4
DERMABOND ADVANCED .7 DNX12 (GAUZE/BANDAGES/DRESSINGS) ×2 IMPLANT
DRAPE STERI IOBAN 125X83 (DRAPES) ×3 IMPLANT
DRILL BIT CANN LG 4.3MM (BIT) ×3
DRSG AQUACEL AG ADV 3.5X 4 (GAUZE/BANDAGES/DRESSINGS) IMPLANT
DRSG AQUACEL AG ADV 3.5X 6 (GAUZE/BANDAGES/DRESSINGS) IMPLANT
DRSG AQUACEL AG ADV 3.5X10 (GAUZE/BANDAGES/DRESSINGS) ×6 IMPLANT
DURAPREP 26ML APPLICATOR (WOUND CARE) ×3 IMPLANT
ELECT REM PT RETURN 15FT ADLT (MISCELLANEOUS) ×3 IMPLANT
GAUZE SPONGE 4X4 12PLY STRL (GAUZE/BANDAGES/DRESSINGS) IMPLANT
GLOVE BIOGEL M 7.0 STRL (GLOVE) IMPLANT
GLOVE BIOGEL PI IND STRL 7.5 (GLOVE) ×1 IMPLANT
GLOVE BIOGEL PI IND STRL 8.5 (GLOVE) IMPLANT
GLOVE BIOGEL PI INDICATOR 7.5 (GLOVE) ×2
GLOVE BIOGEL PI INDICATOR 8.5 (GLOVE)
GLOVE ECLIPSE 8.0 STRL XLNG CF (GLOVE) IMPLANT
GLOVE ORTHO TXT STRL SZ7.5 (GLOVE) ×15 IMPLANT
GOWN STRL REUS W/TWL LRG LVL3 (GOWN DISPOSABLE) ×6 IMPLANT
GOWN STRL REUS W/TWL XL LVL3 (GOWN DISPOSABLE) IMPLANT
GUIDEPIN 3.2X17.5 THRD DISP (PIN) ×3 IMPLANT
HIP FRAC NAIL LAG SCR 10.5X100 (Orthopedic Implant) ×2 IMPLANT
KIT BASIN OR (CUSTOM PROCEDURE TRAY) ×3 IMPLANT
MANIFOLD NEPTUNE II (INSTRUMENTS) ×3 IMPLANT
NAIL HIP FRACT 130D 11X180 (Screw) ×3 IMPLANT
PACK GENERAL/GYN (CUSTOM PROCEDURE TRAY) ×3 IMPLANT
POSITIONER SURGICAL ARM (MISCELLANEOUS) ×6 IMPLANT
SCREW BONE CORTICAL 5.0X3 (Screw) ×3 IMPLANT
SCREW CANN THRD AFF 10.5X100 (Orthopedic Implant) ×1 IMPLANT
SUT MNCRL AB 4-0 PS2 18 (SUTURE) ×3 IMPLANT
SUT VIC AB 1 CT1 27 (SUTURE) ×2
SUT VIC AB 1 CT1 27XBRD ANTBC (SUTURE) ×1 IMPLANT
SUT VIC AB 2-0 CT1 27 (SUTURE) ×4
SUT VIC AB 2-0 CT1 27XBRD (SUTURE) ×2 IMPLANT

## 2017-05-28 NOTE — Anesthesia Preprocedure Evaluation (Signed)
Anesthesia Evaluation  Patient identified by MRN, date of birth, ID band Patient awake    Reviewed: Allergy & Precautions, NPO status , Patient's Chart, lab work & pertinent test results  Airway Mallampati: II  TM Distance: >3 FB Neck ROM: Full    Dental no notable dental hx.    Pulmonary neg pulmonary ROS, former smoker,    Pulmonary exam normal breath sounds clear to auscultation       Cardiovascular negative cardio ROS Normal cardiovascular exam Rhythm:Regular Rate:Normal     Neuro/Psych negative neurological ROS  negative psych ROS   GI/Hepatic negative GI ROS, Neg liver ROS,   Endo/Other  negative endocrine ROSHypothyroidism   Renal/GU negative Renal ROS  negative genitourinary   Musculoskeletal  (+) Arthritis , Osteoarthritis,    Abdominal   Peds negative pediatric ROS (+)  Hematology negative hematology ROS (+)   Anesthesia Other Findings   Reproductive/Obstetrics negative OB ROS                             Anesthesia Physical Anesthesia Plan  ASA: III  Anesthesia Plan: General   Post-op Pain Management:    Induction: Intravenous  PONV Risk Score and Plan: 3 and Treatment may vary due to age or medical condition and Ondansetron  Airway Management Planned: LMA and Oral ETT  Additional Equipment:   Intra-op Plan:   Post-operative Plan: Extubation in OR  Informed Consent: I have reviewed the patients History and Physical, chart, labs and discussed the procedure including the risks, benefits and alternatives for the proposed anesthesia with the patient or authorized representative who has indicated his/her understanding and acceptance.     Plan Discussed with: CRNA, Surgeon and Anesthesiologist  Anesthesia Plan Comments: ( )        Anesthesia Quick Evaluation

## 2017-05-28 NOTE — Progress Notes (Signed)
Patient ID: Julie Hayden, female   DOB: 05/17/1940, 77 y.o.   MRN: [id number] Subjective: Day of Surgery Procedure(s) (LRB): INTRAMEDULLARY (IM) NAIL FEMORAL (Left)    Patient reports pain as moderate. Ready to get it taken care of  Objective:   VITALS:   Vitals:   05/27/17 2055 05/28/17 0559  BP: 139/77 130/71  Pulse: 83 75  Resp: 18 16  Temp: 99 F (37.2 C) 98.9 F (37.2 C)  SpO2: 97% 96%    Neurovascular intact  Left leg slightly ER due to fracture and pain  LABS Recent Labs    05/26/17 1658 05/28/17 0520  HGB 13.1 12.6  HCT 38.2 37.6  WBC 17.3* 9.9  PLT 406* 341    Recent Labs    05/26/17 1658 05/28/17 0520  NA 142 139  K 3.4* 3.9  BUN 18 10  CREATININE 0.68 0.65  GLUCOSE 128* 104*    No results for input(s): LABPT, INR in the last 72 hours.   Assessment/Plan: Day of Surgery Procedure(s) (LRB): INTRAMEDULLARY (IM) NAIL FEMORAL (Left)  Left hip intertrochanteric fracture  Plan: To OR today for ORIF left hip fracture Plan reviewed with patient and her daughter Post op likely WBAT Lovenox for DVT prophylaxis

## 2017-05-28 NOTE — Brief Op Note (Signed)
05/26/2017 - 05/28/2017  3:04 PM  PATIENT:  Lunette Standsebbie Heuer  77 y.o. female  PRE-OPERATIVE DIAGNOSIS:  left hip intertrochanteric fracture  POST-OPERATIVE DIAGNOSIS:  left hip intertrochanteric fracture  PROCEDURE:  Procedure(s): INTRAMEDULLARY (IM) NAIL FEMORAL (Left)  SURGEON:  Surgeon(s) and Role:    Durene Romans* Roniel Halloran, MD - Primary  PHYSICIAN ASSISTANT: None  ANESTHESIA:   general  EBL:  100 cc  BLOOD ADMINISTERED:none  DRAINS: none   LOCAL MEDICATIONS USED:  NONE  SPECIMEN:  No Specimen  DISPOSITION OF SPECIMEN:  N/A  COUNTS:  YES  TOURNIQUET:  * No tourniquets in log *  DICTATION: .Other Dictation: Dictation Number [id number]  PLAN OF CARE: Admit to inpatient   PATIENT DISPOSITION:  PACU - hemodynamically stable.   Delay start of Pharmacological VTE agent (>24hrs) due to surgical blood loss or risk of bleeding: no

## 2017-05-28 NOTE — Anesthesia Postprocedure Evaluation (Signed)
Anesthesia Post Note  Patient: Julie Hayden  Procedure(s) Performed: INTRAMEDULLARY (IM) NAIL FEMORAL (Left )     Patient location during evaluation: PACU Anesthesia Type: General Level of consciousness: awake and alert Pain management: pain level controlled Vital Signs Assessment: post-procedure vital signs reviewed and stable Respiratory status: spontaneous breathing, nonlabored ventilation, respiratory function stable and patient connected to nasal cannula oxygen Cardiovascular status: blood pressure returned to baseline and stable Postop Assessment: no apparent nausea or vomiting Anesthetic complications: no    Last Vitals:  Vitals:   05/28/17 1630 05/28/17 1645  BP: 139/78 132/86  Pulse: 88 91  Resp: 18 14  Temp:    SpO2: 98% 99%    Last Pain:  Vitals:   05/28/17 1645  TempSrc:   PainSc: Asleep                 Glenda Spelman

## 2017-05-28 NOTE — Progress Notes (Signed)
Patient ID: Julie Hayden, female   DOB: 06/12/1940, 77 y.o.   MRN: [id number]    PROGRESS NOTE  Julie Hayden  [id number] DOB: 06/02/1940 DOA: 05/26/2017  PCP: Cheron SchaumannVelazquez, Gretchen Y., MD   Brief Narrative:  Pt is 77 yo female who presented after an episode of mechanical fall and has sustained left hip fracture.   Assessment & Plan:   Principal Problem:   Hip fracture, left intertrochanteric hip fracture - pain controlled this AM  - plan to take to OR this afternoon  - keep NPO  - continue analgesia as needed  - appreciate ortho team assistance   Active Problems:   Leukocytosis - suspect reactive process from acute fractures - no fevers, no clear evidence of an infectious etiology  - now WNL     Anxiety - wants low dose anti anxiety this AM - will give Ativan 0.5 mg IV x 1 dose now     Hypokalemia - supplemented and WNL this AM  DVT prophylaxis: SCD's Code Status: Full  Family Communication: pt at bedside  Disposition Plan: to be determined   Consultants:   Ortho   Procedures:   None  Antimicrobials:   None  Subjective: No events overnight, more anxious this AM.  Objective: Vitals:   05/27/17 0728 05/27/17 1350 05/27/17 2055 05/28/17 0559  BP: (!) 141/70 131/64 139/77 130/71  Pulse: 83 90 83 75  Resp:  18 18 16   Temp: 99.5 F (37.5 C) 99.5 F (37.5 C) 99 F (37.2 C) 98.9 F (37.2 C)  TempSrc: Oral Oral Oral Oral  SpO2: 96% 95% 97% 96%  Weight:      Height:        Intake/Output Summary (Last 24 hours) at 05/28/2017 0852 Last data filed at 05/28/2017 0600 Gross per 24 hour  Intake 794.17 ml  Output 700 ml  Net 94.17 ml   Filed Weights   05/26/17 2127  Weight: 65.9 kg (145 lb 5 oz)    Physical Exam  Constitutional: Appears well-developed and well-nourished. No distress.  CVS: RRR, S1/S2 +, no murmurs, no gallops, no carotid bruit.  Pulmonary: Effort and breath sounds normal, no stridor, rhonchi, wheezes, rales.  Abdominal: Soft. BS +,   no distension, tenderness, rebound or guarding.  Musculoskeletal: TTT in the left hip area  Neuro: Alert. Normal reflexes, muscle tone coordination. No cranial nerve deficit. Skin: Skin is warm and dry. No rash noted. Not diaphoretic. No erythema. No pallor.  Psychiatric: Normal mood and affect. Behavior, judgment, thought content normal.   Data Reviewed: I have personally reviewed following labs and imaging studies  CBC: Recent Labs  Lab 05/26/17 1658 05/28/17 0520  WBC 17.3* 9.9  NEUTROABS 14.7*  --   HGB 13.1 12.6  HCT 38.2 37.6  MCV 93.2 94.0  PLT 406* 341   Basic Metabolic Panel: Recent Labs  Lab 05/26/17 1658 05/28/17 0520  NA 142 139  K 3.4* 3.9  CL 106 104  CO2 25 25  GLUCOSE 128* 104*  BUN 18 10  CREATININE 0.68 0.65  CALCIUM 9.3 9.0    Radiology Studies: Dg Lumbar Spine 2-3 Views Result Date: 05/26/2017 No acute osseous injury of the lumbar spine.   Dg Knee Complete 4 Views Left Result Date: 05/26/2017 No fracture or dislocation.   Dg Hip Unilat W Or Wo Pelvis 2-3 Views Left Result Date: 05/26/2017 1. Acute, essentially nondisplaced intertrochanteric fracture of the left proximal femur. 2. Severe left hip osteoarthritis.  Scheduled Meds: . chlorhexidine  60 mL Topical Once  . cholecalciferol  4,000 Units Oral Daily  . dexamethasone  8 mg Intravenous Once  . povidone-iodine  2 application Topical Once  . sertraline  50 mg Oral Daily  . sodium chloride flush  3 mL Intravenous Q12H     LOS: 2 days   Time spent: 25 minutes  25 minutes with > 50% of time discussing current diagnostic test results, clinical impression and plan of care.  Debbora Presto, MD [facility] Pager [phone]  If 7PM-7AM, please contact night-coverage [email or web address] Password [facility] 05/28/2017, 8:52 AM

## 2017-05-28 NOTE — Anesthesia Procedure Notes (Signed)
Procedure Name: LMA Insertion Date/Time: 05/28/2017 3:20 PM Performed by: Paris LoreBlanton, Lakeysha Slutsky M, CRNA Pre-anesthesia Checklist: Patient identified, Emergency Drugs available, Suction available, Patient being monitored and Timeout performed Patient Re-evaluated:Patient Re-evaluated prior to induction Oxygen Delivery Method: Circle system utilized Preoxygenation: Pre-oxygenation with 100% oxygen Induction Type: IV induction Ventilation: Mask ventilation without difficulty LMA: LMA inserted LMA Size: 4.0 Number of attempts: 1 Placement Confirmation: positive ETCO2 and breath sounds checked- equal and bilateral Tube secured with: Tape

## 2017-05-28 NOTE — Transfer of Care (Signed)
Immediate Anesthesia Transfer of Care Note  Patient: Julie Hayden  Procedure(s) Performed: Procedure(s): INTRAMEDULLARY (IM) NAIL FEMORAL (Left)  Patient Location: PACU  Anesthesia Type:General  Level of Consciousness:  sedated, patient cooperative and responds to stimulation  Airway & Oxygen Therapy:Patient Spontanous Breathing and Patient connected to face mask oxgen  Post-op Assessment:  Report given to PACU RN and Post -op Vital signs reviewed and stable  Post vital signs:  Reviewed and stable  Last Vitals:  Vitals:   05/27/17 2055 05/28/17 0559  BP: 139/77 130/71  Pulse: 83 75  Resp: 18 16  Temp: 37.2 C 37.2 C  SpO2: 97% 96%    Complications: No apparent anesthesia complications

## 2017-05-29 LAB — CBC
HCT: 33.5 % — ABNORMAL LOW (ref 36.0–46.0)
Hemoglobin: 11.3 g/dL — ABNORMAL LOW (ref 12.0–15.0)
MCH: 31.7 pg (ref 26.0–34.0)
MCHC: 33.7 g/dL (ref 30.0–36.0)
MCV: 94.1 fL (ref 78.0–100.0)
PLATELETS: 317 10*3/uL (ref 150–400)
RBC: 3.56 MIL/uL — AB (ref 3.87–5.11)
RDW: 12.5 % (ref 11.5–15.5)
WBC: 12.1 10*3/uL — AB (ref 4.0–10.5)

## 2017-05-29 LAB — BASIC METABOLIC PANEL
ANION GAP: 10 (ref 5–15)
BUN: 12 mg/dL (ref 6–20)
CO2: 26 mmol/L (ref 22–32)
Calcium: 9 mg/dL (ref 8.9–10.3)
Chloride: 100 mmol/L — ABNORMAL LOW (ref 101–111)
Creatinine, Ser: 0.61 mg/dL (ref 0.44–1.00)
GFR calc Af Amer: >60 mL/min (ref >60)
Glucose, Bld: 119 mg/dL — ABNORMAL HIGH (ref 65–99)
POTASSIUM: 4 mmol/L (ref 3.5–5.1)
SODIUM: 136 mmol/L (ref 135–145)

## 2017-05-29 NOTE — Progress Notes (Signed)
    Subjective:  Patient reports pain as moderate.  Denies N/V/CP/SOB.   Objective:   VITALS:   Vitals:   05/28/17 2025 05/28/17 2120 05/29/17 0221 05/29/17 0641  BP: 139/80 129/77 128/76 119/80  Pulse: 90 95 87 80  Resp: 14 18 15 16   Temp: 98.6 F (37 C) 98.3 F (36.8 C) 98.1 F (36.7 C) 97.9 F (36.6 C)  TempSrc: Oral Oral Oral Oral  SpO2: 99% 99% 99% 98%  Weight:      Height:        NAD ABD soft Sensation intact distally Intact pulses distally Dorsiflexion/Plantar flexion intact Incision: dressing C/D/I Compartment soft   Lab Results  Component Value Date   WBC 12.1 (H) 05/29/2017   HGB 11.3 (L) 05/29/2017   HCT 33.5 (L) 05/29/2017   MCV 94.1 05/29/2017   PLT 317 05/29/2017   BMET    Component Value Date/Time   NA 136 05/29/2017 0512   K 4.0 05/29/2017 0512   CL 100 (L) 05/29/2017 0512   CO2 26 05/29/2017 0512   GLUCOSE 119 (H) 05/29/2017 0512   BUN 12 05/29/2017 0512   CREATININE 0.61 05/29/2017 0512   CALCIUM 9.0 05/29/2017 0512   GFRNONAA >60 05/29/2017 0512   GFRAA >60 05/29/2017 0512     Assessment/Plan: 1 Day Post-Op   Principal Problem:   Hip fracture, unspecified laterality, closed, initial encounter ([facility]) Active Problems:   Osteoarthritis   Anxiety   Hip fracture requiring operative repair ([facility])   WBAT with walker DVT ppx: lovenox, SCDs, TEDS PO pain control PT/OT Dispo: D/C planning    Julie OvenBrian J Jhonathan Hayden 05/29/2017, 8:47 AM   Julie FredericBrian Manna Gose, MD Cell [phone]

## 2017-05-29 NOTE — Op Note (Signed)
Julie Hayden, Julie Hayden               ACCOUNT NO.:  [id number]  MEDICAL RECORD NO.:  [id number]  LOCATION:                                 FACILITY:  PHYSICIAN:  Madlyn Frankel. Charlann Boxer, M.D.       DATE OF BIRTH:  DATE OF PROCEDURE:  05/28/2017 DATE OF DISCHARGE:                              OPERATIVE REPORT   PREOPERATIVE DIAGNOSIS:  Left intertrochanteric femur fracture.  POSTOPERATIVE DIAGNOSIS:  Left intertrochanteric femur fracture.  PROCEDURE:  Open reduction and internal fixation of left intertrochanteric femur fracture utilizing Biomet components, size 11 x 180-mm Affixus hip nail with a 130-degree lag screw measuring 100 mm with distal interlock.  SURGEON:  Madlyn Frankel. Charlann Boxer, M.D.  ASSISTANT:  Surgical team.  ANESTHESIA:  General.  SPECIMENS:  None.  COMPLICATION:  None.  BLOOD LOSS:  About 100 cc.  INDICATIONS FOR PROCEDURE:  Julie Hayden is a pleasant 77 year old female, patient of mine, who unfortunately scheduled to have her left hip replaced on March 5th.  She unfortunately had a fall while at a grocery store when she slipped on some tomatoes, landed on her left hip, had immediate onset of pain, was brought to the emergency room where radiographs revealed an intertrochanteric femur fracture.  Due to the orientation of the fracture pattern, I felt that it was too high risk to try to consider doing a total hip arthroplasty in this setting with residual loss of trochanteric function.  I reviewed that I felt that it was best for her to undergo open reduction and internal fixation, allowed the bone to heal and subsequently replaced her hip within the next 3-6 months depending on healing.  Questions were reviewed and risks discussed.  Risks of infection, DVT, component failure, need for future surgery were reviewed as well as the postoperative course.  Consent was obtained.  PROCEDURE IN DETAIL:  The patient was brought to the operative theater. Once adequate anesthesia,  preoperative antibiotics, Ancef administered, she was positioned supine on the Hana table.  Her right unaffected extremity was positioned over the leg holder with bony prominences padded.  The left foot was placed into the foot holding boot.  Traction was then applied against the peroneal post.  Reduction was confirmed radiographically.  The left hip was then prepped and draped in sterile fashion using shower curtain technique.  A time-out was performed identifying the patient, planned procedure and extremity.  Fluoroscopy was brought back to the field identifying landmarks.  An incision was made on proximal tip of the trochanter.  Soft tissue dissection was carried down to the gluteal fascia, this was incised to allow for entry of the components.  A guidewire was then inserted into the tip of the trochanter, past the fracture site, confirmed radiographically.  I then opened up the proximal femur with the starting drill.  The 11 x 180-mm nail with a 130-degree angled lag screw was then inserted and positioned in appropriate orientation in the proximal femur.  Then, using fluoroscopy, we confirmed the orientation of the guidewire for the lag screw.  Once I had it into the relative center of the femoral head in AP and lateral planes, we measured and selected  a 100-mm lag screw and then drilled for the lag screw.  The lag screw was then passed by hand and positioned within 5 mm of the apex of the femoral head.  I then let off the gross traction, then used the compression wheel and medialized the shaft of the femur to the fracture site.  Once this was done, the proximal locking bolt was tightened down and then backed off a quarter turn to allow for further compression with weightbearing.  Once I did this, we then placed a distal interlock through the jig.  This measured 34 mm.  Once this was done, x-rays were obtained in AP and lateral planes.  The wounds were irrigated with normal saline  solution.  The hip was closed in layers with #1 Vicryl in the gluteal fascia, 2-0 Vicryl in the subcutaneous layer.  The distal two wounds were then glued together with Dermabond.  The proximal wound was closed with 4-0 Monocryl, then Dermabond and dry dressings applied.  The patient was then brought to the recovery room in a stable condition, extubated, tolerating the procedure well, findings reviewed with her daughter.  Postoperatively, we used Lovenox for DVT prophylaxis due to diminished activity.  We will have her weightbearing as tolerated.  The hope is to get her healing and functioning as best as possible in the setting of her left hip osteoarthritis to allow for healing prior to proceeding with total hip arthroplasty.     Madlyn FrankelMatthew D. Charlann Boxerlin, M.D.     MDO/MEDQ  D:  05/28/2017  T:  05/29/2017  Job:  [id number]

## 2017-05-29 NOTE — Plan of Care (Signed)
Patient progressing well

## 2017-05-29 NOTE — Progress Notes (Signed)
Physical Therapy Treatment Patient Details Name: Julie Hayden MRN: [id number] DOB: 1940-10-20 Today's Date: 05/29/2017    History of Present Illness Pt is 77 year old female s/p fall with resulting L hip fracture. She underwent L intramedullary nail on 05/28/17.    PT Comments    Pt performed increased gait and PTA introduced LE strengthening exercises to patient.  Pt tolerated well and remains appropriate to return home from hospital with HHPT and support from family.  HEP issued.     Follow Up Recommendations  Home health PT     Equipment Recommendations  Rolling walker with 5" wheels    Recommendations for Other Services       Precautions / Restrictions Precautions Precautions: Fall Restrictions Weight Bearing Restrictions: No LLE Weight Bearing: Weight bearing as tolerated    Mobility  Bed Mobility Overal bed mobility: Needs Assistance Bed Mobility: Supine to Sit;Sit to Supine     Supine to sit: Supervision Sit to supine: Supervision   General bed mobility comments: + use of rail and HOB elevated.  Pt performed with self assistance to negotiate LLE out of bed and back into bed post gait training.    Transfers Overall transfer level: Needs assistance Equipment used: Rolling walker (2 wheeled) Transfers: Sit to/from Stand Sit to Stand: Min assist         General transfer comment: Cues for hand placement to push from seated surface and reach back for seated surface.  Pt performed transfer from edge of bed and from [facility].    Ambulation/Gait Ambulation/Gait assistance: Min assist Ambulation Distance (Feet): 30 Feet Assistive device: Rolling walker (2 wheeled) Gait Pattern/deviations: Step-to pattern;Antalgic;Decreased step length - left;Decreased stance time - left;Trunk flexed   Gait velocity interpretation: Below normal speed for age/gender General Gait Details: Pt required cues for upper trunk control, RW safety and positioning, cues for R foot clearance and  to avoid stomping RLE.     Stairs            Wheelchair Mobility    Modified Rankin (Stroke Patients Only)       Balance Overall balance assessment: Needs assistance   Sitting balance-Leahy Scale: Good       Standing balance-Leahy Scale: Poor                              Cognition Arousal/Alertness: Awake/alert Behavior During Therapy: WFL for tasks assessed/performed Overall Cognitive Status: Within Functional Limits for tasks assessed                                        Exercises General Exercises - Lower Extremity Ankle Circles/Pumps: AROM;Both;20 reps;Supine Quad Sets: AROM;Left;10 reps;Supine Short Arc Quad: AROM;Left;10 reps;Supine Long Arc Quad: AROM;Left;10 reps;Seated Heel Slides: AROM;Left;10 reps;Supine Hip ABduction/ADduction: AROM;Left;10 reps;Supine    General Comments        Pertinent Vitals/Pain Pain Assessment: 0-10 Pain Score: 5  Pain Location: L hip Pain Descriptors / Indicators: Tightness Pain Intervention(s): Monitored during session;Repositioned    Home Living Family/patient expects to be discharged to:: Private residence Living Arrangements: Children Available Help at Discharge: Family;Available 24 hours/day Type of Home: House Home Access: Stairs to enter Entrance Stairs-Rails: None Home Layout: Two level;Bed/bath upstairs Home Equipment: None      Prior Function Level of Independence: Independent          PT  Goals (current goals can now be found in the care plan section) Acute Rehab PT Goals Patient Stated Goal: to go home PT Goal Formulation: With patient Time For Goal Achievement: 06/05/17 Potential to Achieve Goals: Good Progress towards PT goals: Progressing toward goals    Frequency    Min 6X/week      PT Plan Current plan remains appropriate    Co-evaluation              AM-PAC PT "6 Clicks" Daily Activity  Outcome Measure  Difficulty turning over in bed  (including adjusting bedclothes, sheets and blankets)?: Unable Difficulty moving from lying on back to sitting on the side of the bed? : Unable Difficulty sitting down on and standing up from a chair with arms (e.g., wheelchair, bedside commode, etc,.)?: Unable Help needed moving to and from a bed to chair (including a wheelchair)?: A Little Help needed walking in hospital room?: A Little Help needed climbing 3-5 steps with a railing? : A Little 6 Click Score: 12    End of Session Equipment Utilized During Treatment: Gait belt Activity Tolerance: Patient tolerated treatment well Patient left: in chair;with call bell/phone within reach Nurse Communication: Mobility status PT Visit Diagnosis: Unsteadiness on feet (R26.81)     Time: [id number] PT Time Calculation (min) (ACUTE ONLY): 29 min  Charges:  $Gait Training: 8-22 mins $Therapeutic Exercise: 8-22 mins                    G Codes:       Joycelyn RuaAimee Saraiah Bhat, PTA pager [phone]    Florestine AversAimee J Riyana Biel 05/29/2017, 2:40 PM

## 2017-05-29 NOTE — Evaluation (Signed)
Physical Therapy Evaluation Patient Details Name: Julie Hayden MRN: [id number] DOB: 1941/03/06 Today's Date: 05/29/2017   History of Present Illness  Pt is 77 year old female s/p fall with resulting L hip fracture. She underwent L intramedullary nail on 05/28/17.  Clinical Impression  The patient tolerated mobilizing  To BSC then recliner. Will ambulate next visit. Patient plans Dc home with family. Bed and bath on second level. Pt admitted with above diagnosis. Pt currently with functional limitations due to the deficits listed below (see PT Problem List).  Pt will benefit from skilled PT to increase their independence and safety with mobility to allow discharge to the venue listed below.       Follow Up Recommendations Home health PT    Equipment Recommendations  Rolling walker with 5" wheels    Recommendations for Other Services       Precautions / Restrictions Precautions Precautions: Fall Restrictions Weight Bearing Restrictions: No LLE Weight Bearing: Weight bearing as tolerated      Mobility  Bed Mobility Overal bed mobility: Needs Assistance Bed Mobility: Supine to Sit     Supine to sit: Mod assist     General bed mobility comments: assist with the left leg, use of rail and HOB raised  Transfers Overall transfer level: Needs assistance Equipment used: Rolling walker (2 wheeled) Transfers: Sit to/from [employer] Sit to Stand: Min assist;From elevated surface         General transfer comment: min assist to rise and steady. Min cues for hand placement and LE management. The patient stood and pivoted to    [facility].  Ambulation/Gait Ambulation/Gait assistance: Min assist Ambulation Distance (Feet): 5 Feet Assistive device: Rolling walker (2 wheeled) Gait Pattern/deviations: Step-to pattern;Antalgic;Decreased step length - left;Decreased stance time - left     General Gait Details: The patient  ambuleted a short distance. Will need pain meds,  then ambulate,  Stairs            Wheelchair Mobility    Modified Rankin (Stroke Patients Only)       Balance                                             Pertinent Vitals/Pain Pain Assessment: 0-10 Pain Score: 3  Pain Location: L hip Pain Descriptors / Indicators: Tightness Pain Intervention(s): Monitored during session;Patient requesting pain meds-RN notified;Premedicated before session;Ice applied    Home Living Family/patient expects to be discharged to:: Private residence Living Arrangements: Children Available Help at Discharge: Family;Available 24 hours/day Type of Home: House Home Access: Stairs to enter Entrance Stairs-Rails: None Entrance Stairs-Number of Steps: 2 Home Layout: Two level;Bed/bath upstairs Home Equipment: None      Prior Function Level of Independence: Independent               Hand Dominance        Extremity/Trunk Assessment   Upper Extremity Assessment Upper Extremity Assessment: Defer to OT evaluation    Lower Extremity Assessment Lower Extremity Assessment: LLE deficits/detail LLE Deficits / Details: antalgic on the left, able to advance the leg    Cervical / Trunk Assessment Cervical / Trunk Assessment: Normal  Communication   Communication: No difficulties  Cognition Arousal/Alertness: Awake/alert Behavior During Therapy: WFL for tasks assessed/performed Overall Cognitive Status: Within Functional Limits for tasks assessed  General Comments      Exercises     Assessment/Plan    PT Assessment Patient needs continued PT services  PT Problem List Decreased strength;Decreased range of motion;Decreased activity tolerance;Decreased mobility;Decreased knowledge of precautions;Decreased safety awareness;Decreased knowledge of use of DME;Pain       PT Treatment Interventions DME instruction;Therapeutic exercise;Gait training;Stair  training;Functional mobility training;Therapeutic activities;Patient/family education    PT Goals (Current goals can be found in the Care Plan section)  Acute Rehab PT Goals Patient Stated Goal: to go home PT Goal Formulation: With patient Time For Goal Achievement: 06/05/17 Potential to Achieve Goals: Good    Frequency Min 6X/week   Barriers to discharge        Co-evaluation               AM-PAC PT "6 Clicks" Daily Activity  Outcome Measure Difficulty turning over in bed (including adjusting bedclothes, sheets and blankets)?: Unable Difficulty moving from lying on back to sitting on the side of the bed? : Unable Difficulty sitting down on and standing up from a chair with arms (e.g., wheelchair, bedside commode, etc,.)?: Unable Help needed moving to and from a bed to chair (including a wheelchair)?: Total Help needed walking in hospital room?: Total Help needed climbing 3-5 steps with a railing? : Total 6 Click Score: 6    End of Session   Activity Tolerance: Patient tolerated treatment well Patient left: in chair;with call bell/phone within reach Nurse Communication: Mobility status PT Visit Diagnosis: Unsteadiness on feet (R26.81)    Time: [id number] PT Time Calculation (min) (ACUTE ONLY): 48 min   Charges:   PT Evaluation $PT Eval Low Complexity: 1 Low PT Treatments $Gait Training: 8-22 mins $Therapeutic Activity: 8-22 mins   PT G CodesBlanchard Kelch:       Nikesh Teschner PT [phone]   Rada HayHill, Thao Vanover Elizabeth 05/29/2017, 12:49 PM

## 2017-05-29 NOTE — Evaluation (Addendum)
Occupational Therapy Evaluation Patient Details Name: Julie Hayden MRN: [id number] DOB: 1940/06/14 Today's Date: 05/29/2017    History of Present Illness Pt is 77 year old female s/p fall with resulting L hip fracture. She underwent L ORIF on 05/26/17.    Clinical Impression   Pt is doing well and is overall at min to mod assist level on eval for ADL. Family is present and able to assist at d/c. Pt will benefit from continued OT to progress ADL independence. Will follow.     Follow Up Recommendations  No OT follow up    Equipment Recommendations  3 in 1 bedside commode;Tub/shower bench (pt would like to confirm cost before purchase)   Recommendations for Other Services       Precautions / Restrictions Precautions Precautions: Fall Restrictions Weight Bearing Restrictions: No LLE Weight Bearing: Weight bearing as tolerated      Mobility Bed Mobility               General bed mobility comments: pt up in chair when OT arrived.   Transfers Overall transfer level: Needs assistance Equipment used: Rolling walker (2 wheeled) Transfers: Sit to/from Stand Sit to Stand: Min assist         General transfer comment: min assist to rise and steady. Min cues for hand placement and LE management.    Balance                                           ADL either performed or assessed with clinical judgement   ADL Overall ADL's : Needs assistance/impaired Eating/Feeding: Independent;Sitting   Grooming: Min guard;Standing   Upper Body Bathing: Set up;Sitting   Lower Body Bathing: Moderate assistance;Sit to/from stand   Upper Body Dressing : Set up;Sitting   Lower Body Dressing: Moderate assistance;Sit to/from stand   Toilet Transfer: Minimal assistance;Ambulation;RW;BSC   Toileting- Clothing Manipulation and Hygiene: Minimal assistance;Sit to/from stand         General ADL Comments: Educated pt on AE options but pt states her family can assist  if needed. Educated pt on sequence for LB dressing and importance of keeping walker in front of her when she stands to pull up clothing. Min cues for sequencing walker use and for hand placement with transitions. Discussed option of a tub transfer bench and pt is interested in obtaining a bench.      Vision Baseline Vision/History: No visual deficits       Perception     Praxis      Pertinent Vitals/Pain Pain Assessment: 0-10 Pain Score: 3  Pain Location: L hip Pain Descriptors / Indicators: Tightness Pain Intervention(s): Monitored during session;Ice applied     Hand Dominance     Extremity/Trunk Assessment Upper Extremity Assessment Upper Extremity Assessment: Overall WFL for tasks assessed           Communication Communication Communication: No difficulties   Cognition Arousal/Alertness: Awake/alert Behavior During Therapy: WFL for tasks assessed/performed Overall Cognitive Status: Within Functional Limits for tasks assessed                                     General Comments       Exercises     Shoulder Instructions      Home Living Family/patient expects to be discharged to:: Private  residence Living Arrangements: Children Available Help at Discharge: Family;Available 24 hours/day Type of Home: House       Home Layout: Two level     Bathroom Shower/Tub: Chief Strategy OfficerTub/shower unit   Bathroom Toilet: Standard     Home Equipment: None          Prior Functioning/Environment Level of Independence: Independent                 OT Problem List: Decreased strength;Decreased knowledge of use of DME or AE      OT Treatment/Interventions: Self-care/ADL training;DME and/or AE instruction;Therapeutic activities;Patient/family education    OT Goals(Current goals can be found in the care plan section) Acute Rehab OT Goals Patient Stated Goal: to go home OT Goal Formulation: With patient/family Time For Goal Achievement: 06/05/17 Potential  to Achieve Goals: Good  OT Frequency: Min 2X/week   Barriers to D/C:            Co-evaluation              AM-PAC PT "6 Clicks" Daily Activity     Outcome Measure Help from another person eating meals?: None Help from another person taking care of personal grooming?: A Little Help from another person toileting, which includes using toliet, bedpan, or urinal?: A Little Help from another person bathing (including washing, rinsing, drying)?: A Little Help from another person to put on and taking off regular upper body clothing?: None Help from another person to put on and taking off regular lower body clothing?: A Little 6 Click Score: 20   End of Session Equipment Utilized During Treatment: Rolling walker  Activity Tolerance: Patient tolerated treatment well Patient left: with call bell/phone within reach;in bed;with family/visitor present  OT Visit Diagnosis: Unsteadiness on feet (R26.81);Muscle weakness (generalized) (M62.81)                Time: [id number] OT Time Calculation (min): 31 min Charges:  OT General Charges $OT Visit: 1 Visit OT Evaluation $OT Eval Low Complexity: 1 Low OT Treatments $Therapeutic Activity: 8-22 mins G-Codes:       Zannie KehrStephanie S Lecia Esperanza 05/29/2017, 11:47 AM

## 2017-05-29 NOTE — Progress Notes (Addendum)
Patient ID: Julie Hayden, female   DOB: 08/29/1940, 77 y.o.   MRN: [id number]    PROGRESS NOTE  Julie Hayden  MVH:[id number] DOB: 08/29/1940 DOA: 05/26/2017  PCP: Cheron SchaumannVelazquez, Gretchen Y., MD   Brief Narrative:  Pt is 77 yo female who presented after an episode of mechanical fall and has sustained left hip fracture.   Assessment & Plan:   Principal Problem:   Hip fracture, left intertrochanteric hip fracture - s/p hip repair, post op day #1 - pain controlled this AM - PT eval done, HH PT recommended and orders placed - Lovenox for DVT prophylaxis   Active Problems:   Leukocytosis - suspect reactive process from acute fractures and now hip repair  - will repeat CBC in AM    Anxiety - stable this MA     Hypokalemia - resolved   DVT prophylaxis: SCD's Code Status: Full  Family Communication: pt at bedside  Disposition Plan: home in 1-2 days   Consultants:   Ortho   Procedures:   Left hip repair 02/15 -->  Antimicrobials:   None  Subjective: Pt reports feeling better this AM.   Objective: Vitals:   05/29/17 0221 05/29/17 0641 05/29/17 0926 05/29/17 1341  BP: 128/76 119/80 123/75 118/64  Pulse: 87 80 92 97  Resp: 15 16 16 15   Temp: 98.1 F (36.7 C) 97.9 F (36.6 C) 98.2 F (36.8 C) 97.8 F (36.6 C)  TempSrc: Oral Oral Oral Oral  SpO2: 99% 98% 99% 97%  Weight:      Height:        Intake/Output Summary (Last 24 hours) at 05/29/2017 1627 Last data filed at 05/29/2017 1340 Gross per 24 hour  Intake 1995 ml  Output 900 ml  Net 1095 ml   Filed Weights   05/26/17 2127 05/28/17 1424  Weight: 65.9 kg (145 lb 5 oz) 65.9 kg (145 lb 5 oz)   Physical Exam  Constitutional: Appears well-developed and well-nourished. No distress.  CVS: RRR, S1/S2 +, no murmurs, no gallops, no carotid bruit.  Pulmonary: Effort and breath sounds normal, no stridor, rhonchi, wheezes, rales.  Abdominal: Soft. BS +,  no distension, tenderness, rebound or guarding.    Musculoskeletal: Normal range of motion. No edema and no tenderness.   Data Reviewed: I have personally reviewed following labs and imaging studies  CBC: Recent Labs  Lab 05/26/17 1658 05/28/17 0520 05/29/17 0512  WBC 17.3* 9.9 12.1*  NEUTROABS 14.7*  --   --   HGB 13.1 12.6 11.3*  HCT 38.2 37.6 33.5*  MCV 93.2 94.0 94.1  PLT 406* 341 317   Basic Metabolic Panel: Recent Labs  Lab 05/26/17 1658 05/28/17 0520 05/29/17 0512  NA 142 139 136  K 3.4* 3.9 4.0  CL 106 104 100*  CO2 25 25 26   GLUCOSE 128* 104* 119*  BUN 18 10 12   CREATININE 0.68 0.65 0.61  CALCIUM 9.3 9.0 9.0    Radiology Studies: Dg Lumbar Spine 2-3 Views Result Date: 05/26/2017 No acute osseous injury of the lumbar spine.   Dg Knee Complete 4 Views Left Result Date: 05/26/2017 No fracture or dislocation.   Dg Hip Unilat W Or Wo Pelvis 2-3 Views Left Result Date: 05/26/2017 1. Acute, essentially nondisplaced intertrochanteric fracture of the left proximal femur. 2. Severe left hip osteoarthritis.  Scheduled Meds: . cholecalciferol  4,000 Units Oral Daily  . enoxaparin (LOVENOX) injection  40 mg Subcutaneous Q24H  . ferrous sulfate  325 mg Oral TID PC  . sertraline  50 mg Oral Daily  . sodium chloride flush  3 mL Intravenous Q12H     LOS: 3 days   Time spent: 25 minutes  25 minutes with > 50% of time discussing current diagnostic test results, clinical impression and plan of care.  Debbora Presto, MD [facility] Pager [phone]  If 7PM-7AM, please contact night-coverage [email or web address] Password [facility] 05/29/2017, 4:27 PM

## 2017-05-30 LAB — CBC
HCT: 30.3 % — ABNORMAL LOW (ref 36.0–46.0)
Hemoglobin: 10.3 g/dL — ABNORMAL LOW (ref 12.0–15.0)
MCH: 32.1 pg (ref 26.0–34.0)
MCHC: 34 g/dL (ref 30.0–36.0)
MCV: 94.4 fL (ref 78.0–100.0)
PLATELETS: 293 10*3/uL (ref 150–400)
RBC: 3.21 MIL/uL — ABNORMAL LOW (ref 3.87–5.11)
RDW: 12.7 % (ref 11.5–15.5)
WBC: 8.4 10*3/uL (ref 4.0–10.5)

## 2017-05-30 LAB — BASIC METABOLIC PANEL
Anion gap: 9 (ref 5–15)
BUN: 14 mg/dL (ref 6–20)
CO2: 28 mmol/L (ref 22–32)
Calcium: 8.7 mg/dL — ABNORMAL LOW (ref 8.9–10.3)
Chloride: 100 mmol/L — ABNORMAL LOW (ref 101–111)
Creatinine, Ser: 0.55 mg/dL (ref 0.44–1.00)
GFR calc Af Amer: >60 mL/min (ref >60)
Glucose, Bld: 103 mg/dL — ABNORMAL HIGH (ref 65–99)
Potassium: 3.6 mmol/L (ref 3.5–5.1)
Sodium: 137 mmol/L (ref 135–145)

## 2017-05-30 MED ORDER — ENOXAPARIN SODIUM 40 MG/0.4ML ~~LOC~~ SOLN: 40.0000 mg | SUBCUTANEOUS | 0 refills | Status: DC

## 2017-05-30 MED ORDER — OXYCODONE HCL 5 MG PO TABS: 5.0000 mg | ORAL_TABLET | ORAL | 0 refills | Status: DC | PRN

## 2017-05-30 NOTE — Progress Notes (Signed)
Subjective: 2 Days Post-Op Procedure(s) (LRB): INTRAMEDULLARY (IM) NAIL FEMORAL (Left) Patient reports pain as 3 on 0-10 scale.    Objective: Vital signs in last 24 hours: Temp:  [97.8 F (36.6 C)-98.7 F (37.1 C)] 98.7 F (37.1 C) (02/17 0510) Pulse Rate:  [76-97] 85 (02/17 0510) Resp:  [15-17] 15 (02/17 0510) BP: (108-127)/(64-75) 108/68 (02/17 0510) SpO2:  [94 %-99 %] 94 % (02/17 0510)  Intake/Output from previous day: 02/16 0701 - 02/17 0700 In: 480 [P.O.:480] Out: 500 [Urine:500] Intake/Output this shift: No intake/output data recorded.  Recent Labs    05/28/17 0520 05/29/17 0512 05/30/17 0526  HGB 12.6 11.3* 10.3*   Recent Labs    05/29/17 0512 05/30/17 0526  WBC 12.1* 8.4  RBC 3.56* 3.21*  HCT 33.5* 30.3*  PLT 317 293   Recent Labs    05/29/17 0512 05/30/17 0526  NA 136 137  K 4.0 3.6  CL 100* 100*  CO2 26 28  BUN 12 14  CREATININE 0.61 0.55  GLUCOSE 119* 103*  CALCIUM 9.0 8.7*   No results for input(s): LABPT, INR in the last 72 hours.  Neurologically intact Sensation intact distally Intact pulses distally Incision: dressing C/D/I and no drainage  Assessment/Plan: 2 Days Post-Op Procedure(s) (LRB): INTRAMEDULLARY (IM) NAIL FEMORAL (Left) Advance diet Discharge home with home health tomorrow Instr given  Julie Hayden C 05/30/2017, 8:12 AM

## 2017-05-30 NOTE — Progress Notes (Signed)
Physical Therapy Treatment Patient Details Name: Julie Hayden MRN: [id number] DOB: 02-Sep-1940 Today's Date: 05/30/2017    History of Present Illness Pt is 77 year old female s/p fall with resulting L hip fracture. She underwent L intramedullary nail on 05/28/17.    PT Comments    The patien practiced  Steps but will need to practice again with family prior to DC. Progressing very well.    Follow Up Recommendations  Home health PT     Equipment Recommendations  Rolling walker with 5" wheels    Recommendations for Other Services       Precautions / Restrictions Precautions Precautions: Fall Restrictions LLE Weight Bearing: Weight bearing as tolerated    Mobility  Bed Mobility   Bed Mobility: Supine to Sit;Sit to Supine     Supine to sit: Supervision Sit to supine: Supervision   General bed mobility comments: patient self assisted  both legs onto bed  Transfers Overall transfer level: Needs assistance Equipment used: Rolling walker (2 wheeled) Transfers: Sit to/from Stand Sit to Stand: Min guard         General transfer comment: Cues for hand placement to push from seated surface and reach back for seated surface.    Ambulation/Gait Ambulation/Gait assistance: Min assist Ambulation Distance (Feet): 40 Feet Assistive device: Rolling walker (2 wheeled) Gait Pattern/deviations: Step-to pattern;Antalgic;Decreased step length - left;Decreased stance time - left;Trunk flexed     General Gait Details: Pt required cues for upper trunk control, RW safety and positioning, cues for R foot clearance and to avoid stomping RLE.     Stairs Stairs: Yes       General stair comments: 2 steps  forward with left crutch/right rail., 2 steps backward with RW.   Wheelchair Mobility    Modified Rankin (Stroke Patients Only)       Balance                                            Cognition Arousal/Alertness: Awake/alert                                             Exercises Total Joint Exercises Ankle Circles/Pumps: AROM;Both;10 reps Short Arc Quad: AROM;Left;10 reps Heel Slides: AROM;Left Hip ABduction/ADduction: AROM;Left;10 reps    General Comments        Pertinent Vitals/Pain Pain Score: 4  Pain Location: L hip Pain Descriptors / Indicators: Tightness Pain Intervention(s): Monitored during session;Premedicated before session;Ice applied    Home Living                      Prior Function            PT Goals (current goals can now be found in the care plan section) Progress towards PT goals: Progressing toward goals    Frequency    Min 6X/week      PT Plan Current plan remains appropriate    Co-evaluation              AM-PAC PT "6 Clicks" Daily Activity  Outcome Measure  Difficulty turning over in bed (including adjusting bedclothes, sheets and blankets)?: A Little Difficulty moving from lying on back to sitting on the side of the bed? : A Little Difficulty sitting down  on and standing up from a chair with arms (e.g., wheelchair, bedside commode, etc,.)?: A Little Help needed moving to and from a bed to chair (including a wheelchair)?: A Little Help needed walking in hospital room?: A Little Help needed climbing 3-5 steps with a railing? : A Lot 6 Click Score: 17    End of Session Equipment Utilized During Treatment: Gait belt Activity Tolerance: Patient tolerated treatment well Patient left: in bed;with call bell/phone within reach Nurse Communication: Mobility status PT Visit Diagnosis: Unsteadiness on feet (R26.81)     Time: 1412-1440 PT Time Calculation (min) (ACUTE ONLY): 28 min  Charges:  $Gait Training: 8-22 mins $Therapeutic Exercise: 8-22 mins                    G CodesBlanchard Julie Hayden:       Julie Hayden PT [phone]    Julie HayHill, Julie Hayden 05/30/2017, 4:24 PM

## 2017-05-30 NOTE — Progress Notes (Signed)
Physical Therapy Treatment Patient Details Name: Julie StandsDebbie Hayden MRN: [id number] DOB: 05/19/1940 Today's Date: 05/30/2017    History of Present Illness Pt is 77 year old female s/p fall with resulting L hip fracture. She underwent L intramedullary nail on 05/28/17.    PT Comments    The patient is progressing well today. Practice steps next visit. Plans Dc home. Has 13 steps to bed and bath.   Follow Up Recommendations  Home health PT     Equipment Recommendations  Rolling walker with 5" wheels    Recommendations for Other Services       Precautions / Restrictions Precautions Precautions: Fall Restrictions LLE Weight Bearing: Weight bearing as tolerated    Mobility  Bed Mobility   Bed Mobility: Sit to Supine       Sit to supine: Supervision   General bed mobility comments: patient self assisted  both legs onto bed  Transfers Overall transfer level: Needs assistance Equipment used: Rolling walker (2 wheeled) Transfers: Sit to/from Stand Sit to Stand: Min guard         General transfer comment: Cues for hand placement to push from seated surface and reach back for seated surface.    Ambulation/Gait Ambulation/Gait assistance: Min assist Ambulation Distance (Feet): 50 Feet Assistive device: Rolling walker (2 wheeled) Gait Pattern/deviations: Step-to pattern;Antalgic;Decreased step length - left;Decreased stance time - left;Trunk flexed     General Gait Details: Pt required cues for upper trunk control, RW safety and positioning, cues for R foot clearance and to avoid stomping RLE.     Stairs            Wheelchair Mobility    Modified Rankin (Stroke Patients Only)       Balance                                            Cognition Arousal/Alertness: Awake/alert                                            Exercises      General Comments        Pertinent Vitals/Pain Pain Score: 4  Pain Location: L  hip Pain Descriptors / Indicators: Tightness Pain Intervention(s): Monitored during session;Premedicated before session;Ice applied    Home Living                      Prior Function            PT Goals (current goals can now be found in the care plan section) Progress towards PT goals: Progressing toward goals    Frequency    Min 6X/week      PT Plan Current plan remains appropriate    Co-evaluation              AM-PAC PT "6 Clicks" Daily Activity  Outcome Measure  Difficulty turning over in bed (including adjusting bedclothes, sheets and blankets)?: A Little Difficulty moving from lying on back to sitting on the side of the bed? : A Little Difficulty sitting down on and standing up from a chair with arms (e.g., wheelchair, bedside commode, etc,.)?: A Little Help needed moving to and from a bed to chair (including a wheelchair)?: A Little Help needed walking in hospital  room?: A Little Help needed climbing 3-5 steps with a railing? : A Lot 6 Click Score: 17    End of Session Equipment Utilized During Treatment: Gait belt Activity Tolerance: Patient tolerated treatment well Patient left: in bed;with call bell/phone within reach Nurse Communication: Mobility status PT Visit Diagnosis: Unsteadiness on feet (R26.81)     Time: 5621-3086 PT Time Calculation (min) (ACUTE ONLY): 23 min  Charges:  $Gait Training: 23-37 mins                    G CodesBlanchard Hayden PT [phone]    Julie Hayden 05/30/2017, 4:20 PM

## 2017-05-30 NOTE — Progress Notes (Signed)
Patient ID: Julie Hayden, female   DOB: Feb 02, 1941, 77 y.o.   MRN: [id number]    PROGRESS NOTE  Julie Hayden  WUJ:[id number] DOB: 1940-09-16 DOA: 05/26/2017  PCP: Julie Hayden., MD   Brief Narrative:  Pt is 77 yo female who presented after an episode of mechanical fall and has sustained left hip fracture.   Assessment & Plan:   Principal Problem:   Hip fracture, left intertrochanteric hip fracture - s/p hip repair, post op day #2 - pain controlled this AM  - PT eval done, HH PT recommended and orders placed - Lovenox for DVT prophylaxis   Active Problems:   Leukocytosis - suspect reactive process from acute fractures and now hip repair  - resolved     Anxiety - stable this AM     Hypokalemia - resolved   DVT prophylaxis: Lovenox SQ Code Status: Full  Family Communication: pt at bedside  Disposition Plan: home in AM  Consultants:   Ortho   Procedures:   Left hip repair 02/15 -->  Antimicrobials:   None  Subjective: Pt reports feeling better but still sore at the surgical site.   Objective: Vitals:   05/29/17 0926 05/29/17 1341 05/29/17 2123 05/30/17 0510  BP: 123/75 118/64 127/69 108/68  Pulse: 92 97 76 85  Resp: 16 15 17 15   Temp: 98.2 F (36.8 C) 97.8 F (36.6 C) 98.4 F (36.9 C) 98.7 F (37.1 C)  TempSrc: Oral Oral Oral Oral  SpO2: 99% 97% 98% 94%  Weight:      Height:        Intake/Output Summary (Last 24 hours) at 05/30/2017 1148 Last data filed at 05/30/2017 0900 Gross per 24 hour  Intake 480 ml  Output 400 ml  Net 80 ml   Filed Weights   05/26/17 2127 05/28/17 1424  Weight: 65.9 kg (145 lb 5 oz) 65.9 kg (145 lb 5 oz)   Physical Exam  Constitutional: Appears well-developed and well-nourished. No distress.  CVS: RRR, S1/S2 +, no murmurs, no gallops, no carotid bruit.  Pulmonary: Effort and breath sounds normal, no stridor, rhonchi, wheezes, rales.  Abdominal: Soft. BS +,  no distension, tenderness, rebound or guarding.    Musculoskeletal: Normal range of motion. Still mild swelling around the left hip area surgical site  Neuro: Alert. Normal reflexes, muscle tone coordination. No cranial nerve deficit. Skin: Skin is warm and dry. No rash noted. Not diaphoretic. No erythema. No pallor.   Data Reviewed: I have personally reviewed following labs and imaging studies  CBC: Recent Labs  Lab 05/26/17 1658 05/28/17 0520 05/29/17 0512 05/30/17 0526  WBC 17.3* 9.9 12.1* 8.4  NEUTROABS 14.7*  --   --   --   HGB 13.1 12.6 11.3* 10.3*  HCT 38.2 37.6 33.5* 30.3*  MCV 93.2 94.0 94.1 94.4  PLT 406* 341 317 293   Basic Metabolic Panel: Recent Labs  Lab 05/26/17 1658 05/28/17 0520 05/29/17 0512 05/30/17 0526  NA 142 139 136 137  K 3.4* 3.9 4.0 3.6  CL 106 104 100* 100*  CO2 25 25 26 28   GLUCOSE 128* 104* 119* 103*  BUN 18 10 12 14   CREATININE 0.68 0.65 0.61 0.55  CALCIUM 9.3 9.0 9.0 8.7*    Radiology Studies: Dg Lumbar Spine 2-3 Views Result Date: 05/26/2017 No acute osseous injury of the lumbar spine.   Dg Knee Complete 4 Views Left Result Date: 05/26/2017 No fracture or dislocation.   Dg Hip Unilat W Or Wo Pelvis 2-3  Views Left Result Date: 05/26/2017 1. Acute, essentially nondisplaced intertrochanteric fracture of the left proximal femur. 2. Severe left hip osteoarthritis.  Scheduled Meds: . cholecalciferol  4,000 Units Oral Daily  . enoxaparin (LOVENOX) injection  40 mg Subcutaneous Q24H  . ferrous sulfate  325 mg Oral TID PC  . sertraline  50 mg Oral Daily  . sodium chloride flush  3 mL Intravenous Q12H     LOS: 4 days   Time spent: 25 minutes  25 minutes with > 50% of time discussing current diagnostic test results, clinical impression and plan of care.  Debbora PrestoIskra Magick-Laquia Rosano, MD [facility] Pager [phone]  If 7PM-7AM, please contact night-coverage [email or web address] Password [email or web address] 05/30/2017, 11:48 AM

## 2017-05-31 ENCOUNTER — Encounter (HOSPITAL_COMMUNITY): Payer: Self-pay | Admitting: Orthopedic Surgery

## 2017-05-31 LAB — BASIC METABOLIC PANEL
ANION GAP: 9 (ref 5–15)
BUN: 12 mg/dL (ref 6–20)
CALCIUM: 8.9 mg/dL (ref 8.9–10.3)
CO2: 30 mmol/L (ref 22–32)
Chloride: 100 mmol/L — ABNORMAL LOW (ref 101–111)
Creatinine, Ser: 0.66 mg/dL (ref 0.44–1.00)
GFR calc Af Amer: >60 mL/min (ref >60)
GLUCOSE: 109 mg/dL — AB (ref 65–99)
POTASSIUM: 3.9 mmol/L (ref 3.5–5.1)
SODIUM: 139 mmol/L (ref 135–145)

## 2017-05-31 LAB — CBC
HCT: 32 % — ABNORMAL LOW (ref 36.0–46.0)
Hemoglobin: 10.6 g/dL — ABNORMAL LOW (ref 12.0–15.0)
MCH: 31.3 pg (ref 26.0–34.0)
MCHC: 33.1 g/dL (ref 30.0–36.0)
MCV: 94.4 fL (ref 78.0–100.0)
PLATELETS: 328 10*3/uL (ref 150–400)
RBC: 3.39 MIL/uL — ABNORMAL LOW (ref 3.87–5.11)
RDW: 12.5 % (ref 11.5–15.5)
WBC: 6.7 10*3/uL (ref 4.0–10.5)

## 2017-05-31 MED ORDER — ASPIRIN 81 MG PO CHEW: 81.0000 mg | CHEWABLE_TABLET | Freq: Two times a day (BID) | ORAL | 0 refills | Status: AC

## 2017-05-31 NOTE — Progress Notes (Signed)
Physical Therapy Treatment Patient Details Name: Julie Hayden MRN: [id number] DOB: 03/13/1941 Today's Date: 05/31/2017    History of Present Illness Pt is 77 year old female s/p fall with resulting L hip fracture. She underwent L intramedullary nail on 05/28/17.    PT Comments    POD # 3 Pt eager to D/C to home today.  Practiced stairs with pt and daughter.  Instructed on safety and proper tech 2 different situations (front door no rails and inside to second level).  .   Follow Up Recommendations  Home health PT     Equipment Recommendations  Rolling walker with 5" wheels;3in1 (PT);Other (comment)(transfer tub bench)    Recommendations for Other Services       Precautions / Restrictions Precautions Precautions: Fall Restrictions Weight Bearing Restrictions: No LLE Weight Bearing: Weight bearing as tolerated    Mobility  Bed Mobility         Supine to sit: Supervision Sit to supine: Supervision   General bed mobility comments: EOB on arrival  Transfers Overall transfer level: Needs assistance Equipment used: Rolling walker (2 wheeled) Transfers: Sit to/from Stand Sit to Stand: Min guard;Supervision         General transfer comment: Cues for hand placement to push from seated surface and reach back for seated surface.    Ambulation/Gait Ambulation/Gait assistance: Supervision;Min guard Ambulation Distance (Feet): 11 Feet Assistive device: Rolling walker (2 wheeled)   Gait velocity: decreased    General Gait Details: decreaased amb distance as Tx session focused on stairs   Stairs Stairs: Yes     Number of Stairs: 6 General stair comments: first up 4 steps one rail one crutch forward then up 2 backward with walker due to no rails.  Both performed with daughter present for "hands on" instruction   Wheelchair Mobility    Modified Rankin (Stroke Patients Only)       Balance                                             Cognition Arousal/Alertness: Awake/alert Behavior During Therapy: WFL for tasks assessed/performed Overall Cognitive Status: Within Functional Limits for tasks assessed                                        Exercises      General Comments        Pertinent Vitals/Pain Pain Assessment: 0-10 Pain Score: 5  Pain Location: L hip Pain Descriptors / Indicators: Sore;Operative site guarding;Tender Pain Intervention(s): Monitored during session;Repositioned;Premedicated before session;Ice applied    Home Living                      Prior Function            PT Goals (current goals can now be found in the care plan section) Progress towards PT goals: Progressing toward goals    Frequency    Min 6X/week      PT Plan Current plan remains appropriate    Co-evaluation              AM-PAC PT "6 Clicks" Daily Activity  Outcome Measure  Difficulty turning over in bed (including adjusting bedclothes, sheets and blankets)?: A Little Difficulty moving from lying on back to sitting on  the side of the bed? : A Little Difficulty sitting down on and standing up from a chair with arms (e.g., wheelchair, bedside commode, etc,.)?: A Little Help needed moving to and from a bed to chair (including a wheelchair)?: A Little Help needed walking in hospital room?: A Little Help needed climbing 3-5 steps with a railing? : A Lot 6 Click Score: 17    End of Session Equipment Utilized During Treatment: Gait belt Activity Tolerance: Patient tolerated treatment well Patient left: in bed;with call bell/phone within reach Nurse Communication: Mobility status(Pt ready for D/C to home) PT Visit Diagnosis: Unsteadiness on feet (R26.81)     Time: 1610-9604 PT Time Calculation (min) (ACUTE ONLY): 27 min  Charges:  $Gait Training: 8-22 mins $Therapeutic Activity: 8-22 mins                    G Codes:       Felecia Shelling  PTA [facility]  Acute  Rehab Pager       [phone]

## 2017-05-31 NOTE — Progress Notes (Signed)
Discharge instructions completed. Pt  And daughter verbalizes understanding.  Medication regimen reviewed and prescriptions given.

## 2017-05-31 NOTE — Progress Notes (Signed)
Discharge planning, spoke with patient at bedside. [facility]. Contacted [facility] for referral. Needs RW and a transfer tub bench, contacted [facility] to deliver to room. [phone]

## 2017-05-31 NOTE — Care Management Important Message (Signed)
Important Message  Patient Details  Name: Julie Hayden Tallie MRN: [id number] Date of Birth: 06/06/1940   Medicare Important Message Given:  Yes    Caren MacadamFuller, Azrielle Springsteen 05/31/2017, 10:22 AMImportant Message  Patient Details  Name: Julie Hayden Tijerino MRN: [id number] Date of Birth: 03/25/1941   Medicare Important Message Given:  Yes    Caren MacadamFuller, Jobanny Mavis 05/31/2017, 10:22 AM

## 2017-05-31 NOTE — Discharge Summary (Signed)
Physician Discharge Summary  Julie Hayden WUJ:[id number] DOB: 1940/05/07 DOA: 05/26/2017  PCP: Cheron Schaumann., MD  Admit date: 05/26/2017 Discharge date: 05/31/2017  Recommendations for Outpatient Follow-up:  1. Pt will need to follow up with PCP in 1-2 weeks post discharge 2. Please obtain BMP to evaluate electrolytes and kidney function 3. Please also check CBC to evaluate Hg and Hct levels 4. Lovenox for DVT prophylaxis until seen by ortho on follow up   Discharge Diagnoses:  Principal Problem:   Hip fracture, unspecified laterality, closed, initial encounter [facility]) Active Problems:   Osteoarthritis   Anxiety   Hip fracture requiring operative repair [facility])  Discharge Condition: Stable  Diet recommendation: Heart healthy diet discussed in details   History of present illness:  Pt is 77 yo female who presented after an episode of mechanical fall and has sustained left hip fracture.   Assessment & Plan:   Principal Problem:   Hip fracture, left intertrochanteric hip fracture - s/p hip repair, post op day #3 - pain controlled this AM  - PT eval done, HH PT recommended and orders placed - Lovenox for DVT prophylaxis   Active Problems:   Leukocytosis - suspect reactive process from acute fractures and now hip repair  - resolved     Anxiety - stable     Hypokalemia - resolved   DVT prophylaxis: Lovenox SQ Code Status: Full  Family Communication: pt at bedside   Disposition Plan: home   Consultants:   Ortho   Procedures:   Left hip repair 02/15 -->  Antimicrobials:   None  Procedures/Studies: Dg Chest 2 View  Result Date: 05/27/2017 CLINICAL DATA:  Preoperative left intramedullary nailing for hip fracture. EXAM: CHEST  2 VIEW COMPARISON:  None in PACs FINDINGS: The lungs are well-expanded and clear. The heart and pulmonary vascularity are normal. The posterior costophrenic angles are excluded from the study. No large pleural effusion is  observed. The mediastinum is normal in width. There are degenerative changes of both shoulders. IMPRESSION: There is no active cardiopulmonary disease. Electronically Signed   By: David  [place] M.D.   On: 05/27/2017 15:31   Dg Lumbar Spine 2-3 Views  Result Date: 05/26/2017 CLINICAL DATA:  Status post fall, left hip pain, back pain EXAM: LUMBAR SPINE - 2-3 VIEW COMPARISON:  None. FINDINGS: There are 5 nonrib bearing lumbar-type vertebral bodies. The vertebral body heights are maintained. There is generalized osteopenia. The alignment is anatomic. There is no static listhesis. There is no spondylolysis. There is no acute fracture. The disc spaces are relatively well maintained. There is bilateral facet arthropathy at L5-S1. The SI joints are unremarkable. IMPRESSION: No acute osseous injury of the lumbar spine. Electronically Signed   By: Elige Ko   On: 05/26/2017 16:07   Dg Knee Complete 4 Views Left  Result Date: 05/26/2017 CLINICAL DATA:  Fall today; diffuse left hip pain; diffuse left knee pain; pain across lower back EXAM: LEFT KNEE - COMPLETE 4+ VIEW COMPARISON:  None. FINDINGS: No fracture of the proximal tibia or distal femur. Patella is normal. No joint effusion. IMPRESSION: No fracture or dislocation. Electronically Signed   By: Genevive Bi M.D.   On: 05/26/2017 16:08   Dg C-arm 1-60 Min-no Report  Result Date: 05/28/2017 Fluoroscopy was utilized by the requesting physician.  No radiographic interpretation.   Dg Hip Operative Unilat W Or W/o Pelvis Left  Result Date: 05/28/2017 CLINICAL DATA:  Left femoral fracture fixation. EXAM: OPERATIVE LEFT HIP (WITH PELVIS IF  PERFORMED)  VIEWS TECHNIQUE: Fluoroscopic spot image(s) were submitted for interpretation post-operatively. COMPARISON:  05/26/2017 FINDINGS: Intraoperative fluoroscopic images from intramedullary nail and compression screw fixation of previously demonstrated intertrochanteric left femoral fracture with near anatomic  alignment. No evidence of immediate complications. IMPRESSION: Intraoperative fluoroscopic images from intramedullary nail fixation of intertrochanteric left femoral fracture without evidence of immediate complications. Electronically Signed   By: Ted Mcalpineobrinka  Dimitrova M.D.   On: 05/28/2017 16:17   Dg Hip Unilat W Or Wo Pelvis 2-3 Views Left  Result Date: 05/26/2017 CLINICAL DATA:  Left hip pain after fall. EXAM: DG HIP (WITH OR WITHOUT PELVIS) 2-3V LEFT COMPARISON:  None. FINDINGS: There is an acute, essentially nondisplaced intertrochanteric fracture of the left proximal femur, with slight posterior angulation. No dislocation. Severe left hip superior joint space narrowing. The right hip joint is unremarkable. Diffuse osteopenia. The pubic symphysis and sacroiliac joints are intact. IMPRESSION: 1. Acute, essentially nondisplaced intertrochanteric fracture of the left proximal femur. 2. Severe left hip osteoarthritis. Electronically Signed   By: Obie DredgeWilliam T Derry M.D.   On: 05/26/2017 16:05    Discharge Exam: Vitals:   05/30/17 2113 05/31/17 0621  BP: (!) 108/59 112/60  Pulse: 89 73  Resp: 17 17  SpO2: 95% 93%   Vitals:   05/30/17 0510 05/30/17 1419 05/30/17 2113 05/31/17 0621  BP: 108/68 (!) 103/58 (!) 108/59 112/60  Pulse: 85 90 89 73  Resp: 15 16 17 17   TempSrc: Oral Oral Oral Oral  SpO2: 94% 95% 95% 93%  Weight:      Height:       General: Pt is alert, follows commands appropriately, not in acute distress Cardiovascular: Regular rate and rhythm, S1/S2 +, no murmurs, no rubs, no gallops Respiratory: Clear to auscultation bilaterally, no wheezing, no crackles, no rhonchi Abdominal: Soft, non tender, non distended, bowel sounds +, no guarding Extremities: no edema, no cyanosis, pulses palpable bilaterally DP and PT Neuro: Grossly nonfocal  Discharge Instructions  Discharge Instructions    Diet - low sodium heart healthy   Complete by:  As directed    Increase activity slowly    Complete by:  As directed      Allergies as of 05/31/2017   No Known Allergies     Medication List    TAKE these medications   CALCIUM PO Take 1 tablet by mouth daily.   cholecalciferol 1000 units tablet Commonly known as:  VITAMIN D Take 4,000 Units by mouth daily.   enoxaparin 40 MG/0.4ML injection Commonly known as:  LOVENOX Inject 0.4 mLs (40 mg total) into the skin daily.   multivitamin with minerals Tabs tablet Take 1 tablet by mouth daily.   oxyCODONE 5 MG immediate release tablet Commonly known as:  Oxy IR/ROXICODONE Take 1-2 tablets (5-10 mg total) by mouth every 4 (four) hours as needed for breakthrough pain ((for MODERATE breakthrough pain)).   sertraline 50 MG tablet Commonly known as:  ZOLOFT Take 50 mg by mouth daily.   TART CHERRY ADVANCED Caps Take 2 capsules by mouth daily.            Durable Medical Equipment  (From admission, onward)        Start     Ordered   05/31/17 0932  For home use only DME Bedside commode  Once    Question:  Patient needs a bedside commode to treat with the following condition  Answer:  Hip fracture (HCC)   05/31/17 0931   05/31/17 0932  For home use  only DME Shower stool  Once     05/31/17 0931   05/31/17 0931  For home use only DME Walker rolling  Once    Question:  Patient needs a walker to treat with the following condition  Answer:  Hip fracture (HCC)   05/31/17 0931     Follow-up Information    Durene Romans, MD. Schedule an appointment as soon as possible for a visit in 2 week(s).   Specialty:  Orthopedic Surgery Contact information: [address] [place] [address] [place] [address] [phone]        Dorothea Ogle, MD Follow up.   Specialty:  Internal Medicine Why:  Please call me with questions [phone]  Contact information: [address] [place] [address] [phone]        Cheron Schaumann., MD Follow up.   Specialty:  Internal Medicine Contact  information: [address] [place] [address] [phone]           The results of significant diagnostics from this hospitalization (including imaging, microbiology, ancillary and laboratory) are listed below for reference.     Microbiology: Recent Results (from the past 240 hour(s))  Surgical PCR screen     Status: None   Collection Time: 05/27/17  5:49 AM  Result Value Ref Range Status   MRSA, PCR NEGATIVE NEGATIVE Final   Staphylococcus aureus NEGATIVE NEGATIVE Final    Comment: (NOTE) The Xpert SA Assay (FDA approved for NASAL specimens in patients 31 years of age and older), is one component of a comprehensive surveillance program. It is not intended to diagnose infection nor to guide or monitor treatment. Performed at [facility], [address], [place], [place] [address]      Labs: Basic Metabolic Panel: Recent Labs  Lab 05/26/17 1658 05/28/17 0520 05/29/17 0512 05/30/17 0526 05/31/17 0552  NA 142 139 136 137 139  K 3.4* 3.9 4.0 3.6 3.9  CL 106 104 100* 100* 100*  CO2 25 25 26 28 30   GLUCOSE 128* 104* 119* 103* 109*  BUN 18 10 12 14 12   CREATININE 0.68 0.65 0.61 0.55 0.66  CALCIUM 9.3 9.0 9.0 8.7* 8.9   CBC: Recent Labs  Lab 05/26/17 1658 05/28/17 0520 05/29/17 0512 05/30/17 0526 05/31/17 0552  WBC 17.3* 9.9 12.1* 8.4 6.7  NEUTROABS 14.7*  --   --   --   --   HGB 13.1 12.6 11.3* 10.3* 10.6*  HCT 38.2 37.6 33.5* 30.3* 32.0*  MCV 93.2 94.0 94.1 94.4 94.4  PLT 406* 341 317 293 328    SIGNED: Time coordinating discharge: 50 minutes  Debbora Presto, MD  [facility] 05/31/2017, 9:35 AM Pager [phone]  If 7PM-7AM, please contact night-coverage [email or web address] Password [email or web address]

## 2017-05-31 NOTE — Discharge Instructions (Signed)
Hip Fracture A hip fracture is a fracture of the upper part of your thigh bone (femur). What are the causes? A hip fracture is caused by a direct blow to the side of your hip. This is usually the result of a fall but can occur in other circumstances, such as an automobile accident. What increases the risk? There is an increased risk of hip fractures in people with:  An unsteady walking pattern (gait) and those with conditions that contribute to poor balance, such as Parkinson's disease or dementia.  Osteopenia and osteoporosis.  Cancer that spreads to the leg bones.  Certain metabolic diseases.  What are the signs or symptoms? Symptoms of hip fracture include:  Pain over the injured hip.  Inability to put weight on the leg in which the fracture occurred (although, some patients are able to walk after a hip fracture).  Toes and foot of the affected leg point outward when you lie down.  How is this diagnosed? A physical exam can determine if a hip fracture is likely to have occurred. X-ray exams are needed to confirm the fracture and to look for other injuries. The X-ray exam can help to determine the type of hip fracture. Rarely, the fracture is not visible on an X-ray image and a CT scan or MRI will have to be done. How is this treated? The treatment for a fracture is usually surgery. This means using a screw, nail, or rod to hold the bones in place. Follow these instructions at home: Take all medicines as directed by your health care provider. Contact a health care provider if: Pain continues, even after taking pain medicine. This information is not intended to replace advice given to you by your health care provider. Make sure you discuss any questions you have with your health care provider. Document Released: 03/30/2005 Document Revised: 09/05/2015 Document Reviewed: 11/09/2012 Elsevier Interactive Patient Education  2017 Elsevier Inc.  

## 2017-05-31 NOTE — Progress Notes (Signed)
Occupational Therapy Treatment Patient Details Name: Julie Hayden MRN: [id number] DOB: 01/20/1941 Today's Date: 05/31/2017    History of present illness Pt is 77 year old female s/p fall with resulting L hip fracture. She underwent L intramedullary nail on 05/28/17.   OT comments  Completed education. Pt plans d/c home today. She is waiting for her clothing but has already washed up.     Follow Up Recommendations  No OT follow up    Equipment Recommendations  3 in 1 bedside commode;Tub/shower bench    Recommendations for Other Services      Precautions / Restrictions Precautions Precautions: Fall Restrictions LLE Weight Bearing: Weight bearing as tolerated       Mobility Bed Mobility         Supine to sit: Supervision Sit to supine: Supervision   General bed mobility comments: pt used arms to help LLE over edge of bed and used support of RLE in addition to hands to get back up on bed  Transfers                      Balance                                           ADL either performed or assessed with clinical judgement   ADL                                         General ADL Comments: brought tub bench in to show her and demonstrate. She verbalizes understanding, did not want to practice but does want one of these to take home. Educated that it is an out of pocket expense.  She had just used bathroom with nursing. Reviewed safety, sidestepping through tight spaces. Pt is very independent but she doesn't want AE.  Educated on common objects she could use to extend her reach at home.       Vision       Perception     Praxis      Cognition Arousal/Alertness: Awake/alert Behavior During Therapy: WFL for tasks assessed/performed Overall Cognitive Status: Within Functional Limits for tasks assessed                                          Exercises     Shoulder Instructions       General  Comments      Pertinent Vitals/ Pain       Pain Score: 4  Pain Location: L hip Pain Descriptors / Indicators: Sore Pain Intervention(s): Limited activity within patient's tolerance;Monitored during session;Repositioned;Ice applied  Home Living                                          Prior Functioning/Environment              Frequency           Progress Toward Goals  OT Goals(current goals can now be found in the care plan section)  Progress towards OT goals: Progressing toward goals     Plan  Co-evaluation                 AM-PAC PT "6 Clicks" Daily Activity     Outcome Measure   Help from another person eating meals?: None Help from another person taking care of personal grooming?: A Little Help from another person toileting, which includes using toliet, bedpan, or urinal?: A Little Help from another person bathing (including washing, rinsing, drying)?: A Little Help from another person to put on and taking off regular upper body clothing?: A Little Help from another person to put on and taking off regular lower body clothing?: A Little 6 Click Score: 19    End of Session    OT Visit Diagnosis: Pain Pain - Right/Left: Left Pain - part of body: Hip   Activity Tolerance Patient tolerated treatment well   Patient Left with call bell/phone within reach;in bed   Nurse Communication          Time: [id number] OT Time Calculation (min): 21 min  Charges: OT General Charges $OT Visit: 1 Visit OT Treatments $Self Care/Home Management : 8-22 mins  Marica OtterMaryellen Bodi Palmeri, OTR/L [phone] 05/31/2017   [name] 05/31/2017, 9:07 AM

## 2017-06-10 ENCOUNTER — Inpatient Hospital Stay (HOSPITAL_COMMUNITY): Admission: RE | Admit: 2017-06-10 | Payer: Medicare Other | Source: Ambulatory Visit

## 2017-06-15 ENCOUNTER — Encounter (HOSPITAL_COMMUNITY): Admission: RE | Payer: Self-pay | Source: Ambulatory Visit

## 2017-06-15 ENCOUNTER — Inpatient Hospital Stay (HOSPITAL_COMMUNITY): Admission: RE | Admit: 2017-06-15 | Payer: Medicare Other | Source: Ambulatory Visit | Admitting: Orthopedic Surgery

## 2017-06-15 SURGERY — ARTHROPLASTY, HIP, TOTAL, ANTERIOR APPROACH
Anesthesia: Spinal | Site: Hip | Laterality: Left

## 2017-12-20 NOTE — H&P (Signed)
TOTAL HIP ADMISSION H&P  Patient is admitted for left total hip arthroplasty with removal of hardware.  Subjective:  Chief Complaint:  Left hip OA / pain with retained orthopaedic hardware  HPI: Julie PLEGER, 77 y.o. female, has a history of pain and functional disability in the left hip(s) due to trauma and arthritis and patient has failed non-surgical conservative treatments for greater than 12 weeks to include NSAID's and/or analgesics, use of assistive devices and activity modification.  Onset of symptoms was gradual starting 1+ years ago with gradually worsening course since that time.The patient noted prior procedures of the hip to include ORIF on the left hip(s).  Patient currently rates pain in the left hip at 9 out of 10 with activity. Patient has worsening of pain with activity and weight bearing, trendelenberg gait, pain that interfers with activities of daily living and pain with passive range of motion. Patient has evidence of periarticular osteophytes, joint space narrowing and previous ORIF by imaging studies. This condition presents safety issues increasing the risk of falls.  There is no current active infection.  Risks, benefits and expectations were discussed with the patient.  Risks including but not limited to the risk of anesthesia, blood clots, nerve damage, blood vessel damage, failure of the prosthesis, infection and up to and including death.  Patient understand the risks, benefits and expectations and wishes to proceed with surgery.   PCP: Cheron Schaumann., MD  D/C Plans:       Home   Post-op Meds:       No Rx given   Tranexamic Acid:      To be given - IV   Decadron:      Is to be given  FYI:     ASA  Norco  DME:   Pt already has equipment   PT:   No PT    Patient Active Problem List   Diagnosis Date Noted  . Osteoarthritis 05/26/2017  . Anxiety 05/26/2017  . Hip fracture, unspecified laterality, closed, initial encounter (HCC) 05/26/2017  . Hip  fracture requiring operative repair [facility]) 05/26/2017   Past Medical History:  Diagnosis Date  . Anxiety   . Hip fracture, unspecified laterality, closed, initial encounter (HCC) 05/26/2017  . Osteoarthritis 05/26/2017  . Osteoporosis   . Thyroid disease    goiter    Past Surgical History:  Procedure Laterality Date  . ANKLE FRACTURE SURGERY    . CESAREAN SECTION    . FEMUR IM NAIL Left 05/28/2017   Procedure: INTRAMEDULLARY (IM) NAIL FEMORAL;  Surgeon: Durene Romans, MD;  Location: [facility];  Service: Orthopedics;  Laterality: Left;  . THYROID SURGERY      No current facility-administered medications for this encounter.    Current Outpatient Medications  Medication Sig Dispense Refill Last Dose  . CALCIUM PO Take 1 tablet by mouth daily.   05/25/2017 at Unknown time  . cholecalciferol (VITAMIN D) 1000 units tablet Take 4,000 Units by mouth daily.   05/25/2017 at Unknown time  . enoxaparin (LOVENOX) 40 MG/0.4ML injection Inject 0.4 mLs (40 mg total) into the skin daily. 30 Syringe 0   . Misc Natural Products (TART CHERRY ADVANCED) CAPS Take 2 capsules by mouth daily.   05/25/2017 at Unknown time  . Multiple Vitamin (MULTIVITAMIN WITH MINERALS) TABS tablet Take 1 tablet by mouth daily.   05/25/2017 at Unknown time  . oxyCODONE (OXY IR/ROXICODONE) 5 MG immediate release tablet Take 1-2 tablets (5-10 mg total) by mouth every 4 (  four) hours as needed for breakthrough pain ((for MODERATE breakthrough pain)). 30 tablet 0   . sertraline (ZOLOFT) 50 MG tablet Take 50 mg by mouth daily.   05/25/2017 at Unknown time   No Known Allergies   Social History   Tobacco Use  . Smoking status: Former Smoker    Last attempt to quit: 09/21/1978    Years since quitting: 39.2  . Smokeless tobacco: Never Used  Substance Use Topics  . Alcohol use: Yes    Alcohol/week: 7.0 standard drinks    Types: 7 Glasses of wine per week       Review of Systems  Constitutional: Negative.   HENT: Negative.   Eyes:  Negative.   Respiratory: Negative.   Cardiovascular: Negative.   Gastrointestinal: Negative.   Genitourinary: Negative.   Musculoskeletal: Positive for joint pain.  Skin: Negative.   Neurological: Negative.   Endo/Heme/Allergies: Negative.   Psychiatric/Behavioral: The patient is nervous/anxious.     Objective:  Physical Exam  Constitutional: She is oriented to person, place, and time. She appears well-developed.  HENT:  Head: Normocephalic.  Eyes: Pupils are equal, round, and reactive to light.  Neck: Neck supple. No JVD present. No tracheal deviation present. No thyromegaly present.  Cardiovascular: Normal rate, regular rhythm and intact distal pulses.  Respiratory: Effort normal and breath sounds normal. No respiratory distress. She has no wheezes.  GI: Soft. There is no tenderness. There is no guarding.  Musculoskeletal:       Left hip: She exhibits decreased range of motion, decreased strength, tenderness and bony tenderness. She exhibits no swelling, no deformity and no laceration (healed previous incisions).  Lymphadenopathy:    She has no cervical adenopathy.  Neurological: She is alert and oriented to person, place, and time.  Skin: Skin is warm and dry.  Psychiatric: She has a normal mood and affect.      Labs:  Estimated body mass index is 26.58 kg/m as calculated from the following:   Height as of 05/28/17: 5\' 2"  (1.575 m).   Weight as of 05/28/17: 65.9 kg.   Imaging Review Plain radiographs demonstrate severe degenerative joint disease of the left hip(s). The bone quality appears to be good for age and reported activity level.    Preoperative templating of the joint replacement has been completed, documented, and submitted to the Operating Room personnel in order to optimize intra-operative equipment management.     Assessment/Plan:  End stage arthritis, left hip  The patient history, physical examination, clinical judgement of the provider and  imaging studies are consistent with end stage degenerative joint disease of the left hip and total hip arthroplasty is deemed medically necessary. The treatment options including medical management, injection therapy, arthroscopy and arthroplasty were discussed at length. The risks and benefits of total hip arthroplasty were presented and reviewed. The risks due to aseptic loosening, infection, stiffness, dislocation/subluxation,  thromboembolic complications and other imponderables were discussed.  The patient acknowledged the explanation, agreed to proceed with the plan and consent was signed. Patient is being admitted for inpatient treatment for surgery, pain control, PT, OT, prophylactic antibiotics, VTE prophylaxis, progressive ambulation and ADL's and discharge planning.The patient is planning to be discharged home.     Anastasio Auerbach Janaya Broy   PA-C  12/20/2017, 12:53 PM

## 2017-12-23 NOTE — Patient Instructions (Addendum)
Loyda L Millspaugh  12/23/2017   Your procedure is scheduled on: Tuesday 09/1Elon Jester7/2019  [facility]             Report to admitting at  0900 AM    Call this number if you have problems the morning of surgery [phone]    Remember: Do not eat food or drink liquids :After Midnight.     Take these medicines the morning of surgery with A SIP OF WATER:Sertraline ( Zoloft)                                 You may not have any metal on your body including hair pins and              piercings  Do not wear jewelry, make-up, lotions, powders or perfumes, deodorant             Do not wear nail polish.  Do not shave  48 hours prior to surgery.              Do not bring valuables to the hospital. [facility] IS NOT             RESPONSIBLE   FOR VALUABLES.  Contacts, dentures or bridgework may not be worn into surgery.  Leave suitcase in the car. After surgery it may be brought to your room.                  Please read over the following fact sheets you were given: _____________________________________________________________________             [facility] - Pre[facility] Before surgery, you can play an important role.  Because skin is not sterile, your skin needs to be as free of germs as possible.  You can reduce the number of germs on your skin by washing with CHG (chlorahexidine gluconate) soap before surgery.  CHG is an antiseptic cleaner which kills germs and bonds with the skin to continue killing germs even after washing. Please DO NOT use if you have an allergy to CHG or antibacterial soaps.  If your skin becomes reddened/irritated stop using the CHG and inform your nurse when you arrive at Short Stay. Do not shave (including legs and underarms) for at least 48 hours prior to the first CHG shower.  You may shave your face/neck. Please follow these instructions carefully:  1.  Shower with CHG Soap the night before surgery and  the  morning of Surgery.  2.  If you choose to wash your hair, wash your hair first as usual with your  normal  shampoo.  3.  After you shampoo, rinse your hair and body thoroughly to remove the  shampoo.                           4.  Use CHG as you would any other liquid soap.  You can apply chg directly  to the skin and wash                       Gently with a scrungie or clean washcloth.  5.  Apply the CHG Soap to your body ONLY FROM THE NECK DOWN.   Do not use on face/ open  Wound or open sores. Avoid contact with eyes, ears mouth and genitals (private parts).                       Wash face,  Genitals (private parts) with your normal soap.             6.  Wash thoroughly, paying special attention to the area where your surgery  will be performed.  7.  Thoroughly rinse your body with warm water from the neck down.  8.  DO NOT shower/wash with your normal soap after using and rinsing off  the CHG Soap.                9.  Pat yourself dry with a clean towel.            10.  Wear clean pajamas.            11.  Place clean sheets on your bed the night of your first shower and do not  sleep with pets. Day of Surgery : Do not apply any lotions/deodorants the morning of surgery.  Please wear clean clothes to the hospital/surgery center.  FAILURE TO FOLLOW THESE INSTRUCTIONS MAY RESULT IN THE CANCELLATION OF YOUR SURGERY PATIENT SIGNATURE_________________________________  NURSE SIGNATURE__________________________________  ________________________________________________________________________   Adam Phenix  An incentive spirometer is a tool that can help keep your lungs clear and active. This tool measures how well you are filling your lungs with each breath. Taking long deep breaths may help reverse or decrease the chance of developing breathing (pulmonary) problems (especially infection) following:  A long period of time when you are unable to move or be  active. BEFORE THE PROCEDURE   If the spirometer includes an indicator to show your best effort, your nurse or respiratory therapist will set it to a desired goal.  If possible, sit up straight or lean slightly forward. Try not to slouch.  Hold the incentive spirometer in an upright position. INSTRUCTIONS FOR USE  1. Sit on the edge of your bed if possible, or sit up as far as you can in bed or on a chair. 2. Hold the incentive spirometer in an upright position. 3. Breathe out normally. 4. Place the mouthpiece in your mouth and seal your lips tightly around it. 5. Breathe in slowly and as deeply as possible, raising the piston or the ball toward the top of the column. 6. Hold your breath for 3-5 seconds or for as long as possible. Allow the piston or ball to fall to the bottom of the column. 7. Remove the mouthpiece from your mouth and breathe out normally. 8. Rest for a few seconds and repeat Steps 1 through 7 at least 10 times every 1-2 hours when you are awake. Take your time and take a few normal breaths between deep breaths. 9. The spirometer may include an indicator to show your best effort. Use the indicator as a goal to work toward during each repetition. 10. After each set of 10 deep breaths, practice coughing to be sure your lungs are clear. If you have an incision (the cut made at the time of surgery), support your incision when coughing by placing a pillow or rolled up towels firmly against it. Once you are able to get out of bed, walk around indoors and cough well. You may stop using the incentive spirometer when instructed by your caregiver.  RISKS AND COMPLICATIONS  Take your time so you do not get  dizzy or light-headed.  If you are in pain, you may need to take or ask for pain medication before doing incentive spirometry. It is harder to take a deep breath if you are having pain. AFTER USE  Rest and breathe slowly and easily.  It can be helpful to keep track of a log of  your progress. Your caregiver can provide you with a simple table to help with this. If you are using the spirometer at home, follow these instructions: [place] IF:   You are having difficultly using the spirometer.  You have trouble using the spirometer as often as instructed.  Your pain medication is not giving enough relief while using the spirometer.  You develop fever of 100.5 F (38.1 C) or higher. SEEK IMMEDIATE MEDICAL CARE IF:   You cough up bloody sputum that had not been present before.  You develop fever of 102 F (38.9 C) or greater.  You develop worsening pain at or near the incision site. MAKE SURE YOU:   Understand these instructions.  Will watch your condition.  Will get help right away if you are not doing well or get worse. Document Released: 08/10/2006 Document Revised: 06/22/2011 Document Reviewed: 10/11/2006 Exit[employer] Patient Information 2014 Exit[employer], [place].   ________________________________________________________________________  WHAT IS A BLOOD TRANSFUSION? Blood Transfusion Information  A transfusion is the replacement of blood or some of its parts. Blood is made up of multiple cells which provide different functions.  Red blood cells carry oxygen and are used for blood loss replacement.  White blood cells fight against infection.  Platelets control bleeding.  Plasma helps clot blood.  Other blood products are available for specialized needs, such as hemophilia or other clotting disorders. BEFORE THE TRANSFUSION  Who gives blood for transfusions?   Healthy volunteers who are fully evaluated to make sure their blood is safe. This is blood bank blood. Transfusion therapy is the safest it has ever been in the practice of medicine. Before blood is taken from a donor, a complete history is taken to make sure that person has no history of diseases nor engages in risky social behavior (examples are intravenous drug use or sexual activity  with multiple partners). The donor's travel history is screened to minimize risk of transmitting infections, such as malaria. The donated blood is tested for signs of infectious diseases, such as HIV and hepatitis. The blood is then tested to be sure it is compatible with you in order to minimize the chance of a transfusion reaction. If you or a relative donates blood, this is often done in anticipation of surgery and is not appropriate for emergency situations. It takes many days to process the donated blood. RISKS AND COMPLICATIONS Although transfusion therapy is very safe and saves many lives, the main dangers of transfusion include:   Getting an infectious disease.  Developing a transfusion reaction. This is an allergic reaction to something in the blood you were given. Every precaution is taken to prevent this. The decision to have a blood transfusion has been considered carefully by your caregiver before blood is given. Blood is not given unless the benefits outweigh the risks. AFTER THE TRANSFUSION  Right after receiving a blood transfusion, you will usually feel much better and more energetic. This is especially true if your red blood cells have gotten low (anemic). The transfusion raises the level of the red blood cells which carry oxygen, and this usually causes an energy increase.  The nurse administering the transfusion will  monitor you carefully for complications. HOME CARE INSTRUCTIONS  No special instructions are needed after a transfusion. You may find your energy is better. Speak with your caregiver about any limitations on activity for underlying diseases you may have. SEEK MEDICAL CARE IF:   Your condition is not improving after your transfusion.  You develop redness or irritation at the intravenous (IV) site. SEEK IMMEDIATE MEDICAL CARE IF:  Any of the following symptoms occur over the next 12 hours:  Shaking chills.  You have a temperature by mouth above 102 F (38.9  C), not controlled by medicine.  Chest, back, or muscle pain.  People around you feel you are not acting correctly or are confused.  Shortness of breath or difficulty breathing.  Dizziness and fainting.  You get a rash or develop hives.  You have a decrease in urine output.  Your urine turns a dark color or changes to pink, red, or brown. Any of the following symptoms occur over the next 10 days:  You have a temperature by mouth above 102 F (38.9 C), not controlled by medicine.  Shortness of breath.  Weakness after normal activity.  The white part of the eye turns yellow (jaundice).  You have a decrease in the amount of urine or are urinating less often.  Your urine turns a dark color or changes to pink, red, or brown. Document Released: 03/27/2000 Document Revised: 06/22/2011 Document Reviewed: 11/14/2007 [facility] Patient Information 2014 [place], [place].  _______________________________________________________________________

## 2017-12-24 ENCOUNTER — Encounter (HOSPITAL_COMMUNITY): Payer: Self-pay

## 2017-12-24 ENCOUNTER — Other Ambulatory Visit: Payer: Self-pay | Admitting: Orthopedic Surgery

## 2017-12-24 ENCOUNTER — Other Ambulatory Visit: Payer: Self-pay

## 2017-12-24 ENCOUNTER — Encounter (HOSPITAL_COMMUNITY)
Admission: RE | Admit: 2017-12-24 | Discharge: 2017-12-24 | Disposition: A | Discharge status: Home / Self Care | Payer: Medicare Other | Source: Ambulatory Visit | Attending: Orthopedic Surgery | Admitting: Orthopedic Surgery

## 2017-12-24 DIAGNOSIS — M25552 Pain in left hip: Secondary | ICD-10-CM | POA: Insufficient documentation

## 2017-12-24 DIAGNOSIS — Z01812 Encounter for preprocedural laboratory examination: Secondary | ICD-10-CM | POA: Diagnosis not present

## 2017-12-24 LAB — SURGICAL PCR SCREEN
MRSA, PCR: NEGATIVE
STAPHYLOCOCCUS AUREUS: NEGATIVE

## 2017-12-24 NOTE — Care Plan (Addendum)
L THA scheduled on 12-28-17 DCP:  Home with dtr.  2 story home with 3 ste.  BR is on the 2nd floor.   DME:  No Needs.  Has a RW and 3-in-1. PT:  HEP

## 2017-12-25 LAB — ABO/RH: ABO/RH(D): O POS

## 2017-12-28 ENCOUNTER — Encounter (HOSPITAL_COMMUNITY)
Admission: RE | Disposition: A | Discharge status: Home / Self Care | Payer: Self-pay | Source: Ambulatory Visit | Attending: Orthopedic Surgery

## 2017-12-28 ENCOUNTER — Encounter (HOSPITAL_COMMUNITY): Payer: Self-pay | Admitting: *Deleted

## 2017-12-28 ENCOUNTER — Inpatient Hospital Stay (HOSPITAL_COMMUNITY): Payer: Medicare Other | Admitting: Certified Registered Nurse Anesthetist

## 2017-12-28 ENCOUNTER — Inpatient Hospital Stay (HOSPITAL_COMMUNITY)
Admission: RE | Admit: 2017-12-28 | Discharge: 2017-12-30 | DRG: 470 | Disposition: A | Discharge status: Home / Self Care | Payer: Medicare Other | Source: Ambulatory Visit | Attending: Orthopedic Surgery | Admitting: Orthopedic Surgery

## 2017-12-28 ENCOUNTER — Other Ambulatory Visit: Payer: Self-pay

## 2017-12-28 ENCOUNTER — Inpatient Hospital Stay (HOSPITAL_COMMUNITY): Payer: Medicare Other

## 2017-12-28 DIAGNOSIS — Z87891 Personal history of nicotine dependence: Secondary | ICD-10-CM | POA: Diagnosis not present

## 2017-12-28 DIAGNOSIS — F419 Anxiety disorder, unspecified: Secondary | ICD-10-CM | POA: Diagnosis present

## 2017-12-28 DIAGNOSIS — M810 Age-related osteoporosis without current pathological fracture: Secondary | ICD-10-CM | POA: Diagnosis present

## 2017-12-28 DIAGNOSIS — M1612 Unilateral primary osteoarthritis, left hip: Secondary | ICD-10-CM | POA: Diagnosis present

## 2017-12-28 DIAGNOSIS — Z6825 Body mass index (BMI) 25.0-25.9, adult: Secondary | ICD-10-CM | POA: Diagnosis not present

## 2017-12-28 DIAGNOSIS — Z7982 Long term (current) use of aspirin: Secondary | ICD-10-CM

## 2017-12-28 DIAGNOSIS — E039 Hypothyroidism, unspecified: Secondary | ICD-10-CM | POA: Diagnosis present

## 2017-12-28 DIAGNOSIS — Z96649 Presence of unspecified artificial hip joint: Secondary | ICD-10-CM

## 2017-12-28 DIAGNOSIS — M25552 Pain in left hip: Secondary | ICD-10-CM | POA: Diagnosis present

## 2017-12-28 DIAGNOSIS — Z96642 Presence of left artificial hip joint: Secondary | ICD-10-CM

## 2017-12-28 DIAGNOSIS — E663 Overweight: Secondary | ICD-10-CM | POA: Diagnosis present

## 2017-12-28 HISTORY — PX: CONVERSION TO TOTAL HIP: SHX5784

## 2017-12-28 LAB — TYPE AND SCREEN
ABO/RH(D): O POS
Antibody Screen: NEGATIVE

## 2017-12-28 SURGERY — CONVERSION, PREVIOUS HIP SURGERY, TO TOTAL HIP ARTHROPLASTY
Anesthesia: Spinal | Site: Hip | Laterality: Left

## 2017-12-28 MED ORDER — METOCLOPRAMIDE HCL 5 MG PO TABS: 5.0000 mg | ORAL_TABLET | Freq: Three times a day (TID) | ORAL | Status: DC | PRN

## 2017-12-28 MED ORDER — HYDROMORPHONE HCL 1 MG/ML IJ SOLN
INTRAMUSCULAR | Status: AC
  Filled 2017-12-28: qty 1

## 2017-12-28 MED ORDER — DIPHENHYDRAMINE HCL 12.5 MG/5ML PO ELIX: 12.5000 mg | ORAL_SOLUTION | ORAL | Status: DC | PRN

## 2017-12-28 MED ORDER — FENTANYL CITRATE (PF) 100 MCG/2ML IJ SOLN
INTRAMUSCULAR | Status: AC
  Filled 2017-12-28: qty 2

## 2017-12-28 MED ORDER — DOCUSATE SODIUM 100 MG PO CAPS
100.0000 mg | ORAL_CAPSULE | Freq: Two times a day (BID) | ORAL | Status: DC
  Administered 2017-12-28 – 2017-12-29 (×3): 100 mg via ORAL
  Filled 2017-12-28 (×4): qty 1

## 2017-12-28 MED ORDER — OXYCODONE HCL 5 MG PO TABS: 5.0000 mg | ORAL_TABLET | Freq: Once | ORAL | Status: DC | PRN

## 2017-12-28 MED ORDER — PHENYLEPHRINE HCL 10 MG/ML IJ SOLN
INTRAMUSCULAR | Status: AC
  Filled 2017-12-28: qty 1

## 2017-12-28 MED ORDER — SODIUM CHLORIDE 0.9 % IV SOLN
INTRAVENOUS | Status: DC | PRN
  Administered 2017-12-28: 50 ug/min via INTRAVENOUS

## 2017-12-28 MED ORDER — ONDANSETRON HCL 4 MG/2ML IJ SOLN
INTRAMUSCULAR | Status: AC
  Filled 2017-12-28: qty 2

## 2017-12-28 MED ORDER — DEXAMETHASONE SODIUM PHOSPHATE 10 MG/ML IJ SOLN: 10.0000 mg | Freq: Once | INTRAMUSCULAR | Status: DC

## 2017-12-28 MED ORDER — METHOCARBAMOL 500 MG IVPB - SIMPLE MED
INTRAVENOUS | Status: AC
  Administered 2017-12-28: 500 mg via INTRAVENOUS
  Filled 2017-12-28: qty 50

## 2017-12-28 MED ORDER — PROPOFOL 10 MG/ML IV BOLUS
INTRAVENOUS | Status: AC
  Filled 2017-12-28: qty 60

## 2017-12-28 MED ORDER — MENTHOL 3 MG MT LOZG: 1.0000 | LOZENGE | OROMUCOSAL | Status: DC | PRN

## 2017-12-28 MED ORDER — POLYETHYLENE GLYCOL 3350 17 G PO PACK: 17.0000 g | PACK | Freq: Two times a day (BID) | ORAL | 0 refills | Status: AC

## 2017-12-28 MED ORDER — METOCLOPRAMIDE HCL 5 MG/ML IJ SOLN: 5.0000 mg | Freq: Three times a day (TID) | INTRAMUSCULAR | Status: DC | PRN

## 2017-12-28 MED ORDER — CEFAZOLIN SODIUM-DEXTROSE 2-4 GM/100ML-% IV SOLN
2.0000 g | Freq: Four times a day (QID) | INTRAVENOUS | Status: AC
  Administered 2017-12-28 (×2): 2 g via INTRAVENOUS
  Filled 2017-12-28 (×2): qty 100

## 2017-12-28 MED ORDER — CELECOXIB 200 MG PO CAPS
200.0000 mg | ORAL_CAPSULE | Freq: Two times a day (BID) | ORAL | Status: DC
  Administered 2017-12-28 – 2017-12-30 (×4): 200 mg via ORAL
  Filled 2017-12-28 (×4): qty 1

## 2017-12-28 MED ORDER — MIDAZOLAM HCL 2 MG/2ML IJ SOLN
INTRAMUSCULAR | Status: AC
  Filled 2017-12-28: qty 2

## 2017-12-28 MED ORDER — MAGNESIUM CITRATE PO SOLN: 1.0000 | Freq: Once | ORAL | Status: DC | PRN

## 2017-12-28 MED ORDER — FERROUS SULFATE 325 (65 FE) MG PO TABS: 325.0000 mg | ORAL_TABLET | Freq: Three times a day (TID) | ORAL | 3 refills | Status: AC

## 2017-12-28 MED ORDER — DEXAMETHASONE SODIUM PHOSPHATE 10 MG/ML IJ SOLN
INTRAMUSCULAR | Status: DC | PRN
  Administered 2017-12-28: 10 mg via INTRAVENOUS

## 2017-12-28 MED ORDER — DEXAMETHASONE SODIUM PHOSPHATE 10 MG/ML IJ SOLN
INTRAMUSCULAR | Status: AC
  Filled 2017-12-28: qty 1

## 2017-12-28 MED ORDER — HYDROMORPHONE HCL 1 MG/ML IJ SOLN
0.2500 mg | INTRAMUSCULAR | Status: DC | PRN
  Administered 2017-12-28 (×2): 0.5 mg via INTRAVENOUS

## 2017-12-28 MED ORDER — OXYCODONE HCL 5 MG/5ML PO SOLN
5.0000 mg | Freq: Once | ORAL | Status: DC | PRN
  Filled 2017-12-28: qty 5

## 2017-12-28 MED ORDER — FENTANYL CITRATE (PF) 100 MCG/2ML IJ SOLN
INTRAMUSCULAR | Status: DC | PRN
  Administered 2017-12-28: 100 ug via INTRAVENOUS

## 2017-12-28 MED ORDER — TRANEXAMIC ACID 1000 MG/10ML IV SOLN
1000.0000 mg | Freq: Once | INTRAVENOUS | Status: AC
  Administered 2017-12-28: 1000 mg via INTRAVENOUS
  Filled 2017-12-28: qty 1000

## 2017-12-28 MED ORDER — LACTATED RINGERS IV SOLN
INTRAVENOUS | Status: DC
  Administered 2017-12-28 (×2): via INTRAVENOUS

## 2017-12-28 MED ORDER — POLYETHYLENE GLYCOL 3350 17 G PO PACK
17.0000 g | PACK | Freq: Two times a day (BID) | ORAL | Status: DC
  Administered 2017-12-28 – 2017-12-29 (×3): 17 g via ORAL
  Filled 2017-12-28 (×4): qty 1

## 2017-12-28 MED ORDER — HYDROCODONE-ACETAMINOPHEN 5-325 MG PO TABS
1.0000 | ORAL_TABLET | ORAL | Status: DC | PRN
  Administered 2017-12-28 (×2): 2 via ORAL
  Administered 2017-12-29 – 2017-12-30 (×7): 1 via ORAL
  Filled 2017-12-28 (×3): qty 1
  Filled 2017-12-28: qty 2
  Filled 2017-12-28 (×4): qty 1
  Filled 2017-12-28: qty 2

## 2017-12-28 MED ORDER — SERTRALINE HCL 100 MG PO TABS
100.0000 mg | ORAL_TABLET | Freq: Every day | ORAL | Status: DC
  Administered 2017-12-29 – 2017-12-30 (×2): 100 mg via ORAL
  Filled 2017-12-28 (×2): qty 1

## 2017-12-28 MED ORDER — ASPIRIN 81 MG PO CHEW: 81.0000 mg | CHEWABLE_TABLET | Freq: Two times a day (BID) | ORAL | 0 refills | Status: AC

## 2017-12-28 MED ORDER — DOCUSATE SODIUM 100 MG PO CAPS: 100.0000 mg | ORAL_CAPSULE | Freq: Two times a day (BID) | ORAL | 0 refills | Status: AC

## 2017-12-28 MED ORDER — PROMETHAZINE HCL 25 MG/ML IJ SOLN: 6.2500 mg | INTRAMUSCULAR | Status: DC | PRN

## 2017-12-28 MED ORDER — METHOCARBAMOL 500 MG PO TABS: 500.0000 mg | ORAL_TABLET | Freq: Four times a day (QID) | ORAL | 0 refills | Status: AC | PRN

## 2017-12-28 MED ORDER — DEXAMETHASONE SODIUM PHOSPHATE 10 MG/ML IJ SOLN
10.0000 mg | Freq: Once | INTRAMUSCULAR | Status: AC
  Administered 2017-12-29: 10 mg via INTRAVENOUS
  Filled 2017-12-28: qty 1

## 2017-12-28 MED ORDER — ALUM & MAG HYDROXIDE-SIMETH 200-200-20 MG/5ML PO SUSP: 15.0000 mL | ORAL | Status: DC | PRN

## 2017-12-28 MED ORDER — METHOCARBAMOL 500 MG PO TABS
500.0000 mg | ORAL_TABLET | Freq: Four times a day (QID) | ORAL | Status: DC | PRN
  Administered 2017-12-30: 500 mg via ORAL
  Filled 2017-12-28: qty 1

## 2017-12-28 MED ORDER — FERROUS SULFATE 325 (65 FE) MG PO TABS
325.0000 mg | ORAL_TABLET | Freq: Three times a day (TID) | ORAL | Status: DC
  Administered 2017-12-29 – 2017-12-30 (×4): 325 mg via ORAL
  Filled 2017-12-28 (×4): qty 1

## 2017-12-28 MED ORDER — PROPOFOL 500 MG/50ML IV EMUL
INTRAVENOUS | Status: DC | PRN
  Administered 2017-12-28: 100 ug/kg/min via INTRAVENOUS

## 2017-12-28 MED ORDER — TRANEXAMIC ACID 1000 MG/10ML IV SOLN
1000.0000 mg | INTRAVENOUS | Status: AC
  Administered 2017-12-28: 1000 mg via INTRAVENOUS
  Filled 2017-12-28: qty 10

## 2017-12-28 MED ORDER — STERILE WATER FOR IRRIGATION IR SOLN
Status: DC | PRN
  Administered 2017-12-28: 2000 mL

## 2017-12-28 MED ORDER — MORPHINE SULFATE (PF) 2 MG/ML IV SOLN: 0.5000 mg | INTRAVENOUS | Status: DC | PRN

## 2017-12-28 MED ORDER — MIDAZOLAM HCL 5 MG/5ML IJ SOLN
INTRAMUSCULAR | Status: DC | PRN
  Administered 2017-12-28: 2 mg via INTRAVENOUS

## 2017-12-28 MED ORDER — SODIUM CHLORIDE 0.9 % IV SOLN
INTRAVENOUS | Status: DC
  Administered 2017-12-28 – 2017-12-29 (×2): via INTRAVENOUS

## 2017-12-28 MED ORDER — HYDROCODONE-ACETAMINOPHEN 7.5-325 MG PO TABS: 1.0000 | ORAL_TABLET | ORAL | Status: DC | PRN

## 2017-12-28 MED ORDER — CHLORHEXIDINE GLUCONATE 4 % EX LIQD: 60.0000 mL | Freq: Once | CUTANEOUS | Status: DC

## 2017-12-28 MED ORDER — BUPIVACAINE IN DEXTROSE 0.75-8.25 % IT SOLN
INTRATHECAL | Status: DC | PRN
  Administered 2017-12-28: 2 mL via INTRATHECAL

## 2017-12-28 MED ORDER — METHOCARBAMOL 500 MG IVPB - SIMPLE MED
500.0000 mg | Freq: Four times a day (QID) | INTRAVENOUS | Status: DC | PRN
  Administered 2017-12-28: 500 mg via INTRAVENOUS
  Filled 2017-12-28: qty 50

## 2017-12-28 MED ORDER — ONDANSETRON HCL 4 MG/2ML IJ SOLN
INTRAMUSCULAR | Status: DC | PRN
  Administered 2017-12-28: 4 mg via INTRAVENOUS

## 2017-12-28 MED ORDER — PHENOL 1.4 % MT LIQD: 1.0000 | OROMUCOSAL | Status: DC | PRN

## 2017-12-28 MED ORDER — PHENYLEPHRINE 40 MCG/ML (10ML) SYRINGE FOR IV PUSH (FOR BLOOD PRESSURE SUPPORT)
PREFILLED_SYRINGE | INTRAVENOUS | Status: DC | PRN
  Administered 2017-12-28 (×2): 80 ug via INTRAVENOUS

## 2017-12-28 MED ORDER — PHENYLEPHRINE 40 MCG/ML (10ML) SYRINGE FOR IV PUSH (FOR BLOOD PRESSURE SUPPORT)
PREFILLED_SYRINGE | INTRAVENOUS | Status: AC
  Filled 2017-12-28: qty 10

## 2017-12-28 MED ORDER — BISACODYL 10 MG RE SUPP: 10.0000 mg | Freq: Every day | RECTAL | Status: DC | PRN

## 2017-12-28 MED ORDER — ACETAMINOPHEN 325 MG PO TABS: 325.0000 mg | ORAL_TABLET | Freq: Four times a day (QID) | ORAL | Status: DC | PRN

## 2017-12-28 MED ORDER — ONDANSETRON HCL 4 MG PO TABS: 4.0000 mg | ORAL_TABLET | Freq: Four times a day (QID) | ORAL | Status: DC | PRN

## 2017-12-28 MED ORDER — HYDROCODONE-ACETAMINOPHEN 7.5-325 MG PO TABS: 1.0000 | ORAL_TABLET | ORAL | 0 refills | Status: AC | PRN

## 2017-12-28 MED ORDER — SODIUM CHLORIDE 0.9 % IR SOLN
Status: DC | PRN
  Administered 2017-12-28: 1000 mL

## 2017-12-28 MED ORDER — CEFAZOLIN SODIUM-DEXTROSE 2-4 GM/100ML-% IV SOLN
2.0000 g | INTRAVENOUS | Status: AC
  Administered 2017-12-28: 2 g via INTRAVENOUS
  Filled 2017-12-28: qty 100

## 2017-12-28 MED ORDER — ONDANSETRON HCL 4 MG/2ML IJ SOLN: 4.0000 mg | Freq: Four times a day (QID) | INTRAMUSCULAR | Status: DC | PRN

## 2017-12-28 MED ORDER — ASPIRIN 81 MG PO CHEW
81.0000 mg | CHEWABLE_TABLET | Freq: Two times a day (BID) | ORAL | Status: DC
  Administered 2017-12-28 – 2017-12-30 (×4): 81 mg via ORAL
  Filled 2017-12-28 (×4): qty 1

## 2017-12-28 SURGICAL SUPPLY — 39 items
BAG ZIPLOCK 12X15 (MISCELLANEOUS) ×3 IMPLANT
BLADE SAW SGTL 18X1.27X75 (BLADE) ×2 IMPLANT
BLADE SAW SGTL 18X1.27X75MM (BLADE) ×1
COVER SURGICAL LIGHT HANDLE (MISCELLANEOUS) ×3 IMPLANT
CUP ACETBLR 52 OD PINNACLE (Hips) ×3 IMPLANT
DERMABOND ADVANCED (GAUZE/BANDAGES/DRESSINGS) ×2
DERMABOND ADVANCED .7 DNX12 (GAUZE/BANDAGES/DRESSINGS) ×1 IMPLANT
DRAPE STERI IOBAN 125X83 (DRAPES) ×3 IMPLANT
DRAPE U-SHAPE 47X51 STRL (DRAPES) ×9 IMPLANT
DRESSING AQUACEL AG SP 3.5X10 (GAUZE/BANDAGES/DRESSINGS) ×1 IMPLANT
DRSG AQUACEL AG ADV 3.5X 6 (GAUZE/BANDAGES/DRESSINGS) ×3 IMPLANT
DRSG AQUACEL AG SP 3.5X10 (GAUZE/BANDAGES/DRESSINGS) ×3
DURAPREP 26ML APPLICATOR (WOUND CARE) ×3 IMPLANT
ELECT REM PT RETURN 15FT ADLT (MISCELLANEOUS) ×3 IMPLANT
ELIMINATOR HOLE APEX DEPUY (Hips) ×3 IMPLANT
GLOVE BIOGEL PI IND STRL 7.5 (GLOVE) ×8 IMPLANT
GLOVE BIOGEL PI IND STRL 8.5 (GLOVE) IMPLANT
GLOVE BIOGEL PI INDICATOR 7.5 (GLOVE) ×16
GLOVE BIOGEL PI INDICATOR 8.5 (GLOVE)
GLOVE ECLIPSE 8.0 STRL XLNG CF (GLOVE) IMPLANT
GLOVE ORTHO TXT STRL SZ7.5 (GLOVE) ×6 IMPLANT
GOWN STRL REUS W/TWL 2XL LVL3 (GOWN DISPOSABLE) ×6 IMPLANT
GOWN STRL REUS W/TWL LRG LVL3 (GOWN DISPOSABLE) ×18 IMPLANT
GUIDEPIN VERSANAIL DSP 3.2X444 (ORTHOPEDIC DISPOSABLE SUPPLIES) ×6 IMPLANT
HEAD CERAMIC 36 PLUS5 (Hips) ×3 IMPLANT
LINER NEUTRAL 52X36MM PLUS 4 (Liner) ×3 IMPLANT
MANIFOLD NEPTUNE II (INSTRUMENTS) ×3 IMPLANT
PACK ANTERIOR HIP CUSTOM (KITS) ×3 IMPLANT
SCREW 6.5MMX35MM (Screw) ×3 IMPLANT
STEM TRI LOC BPS SZ6 W GRIPTON ×1 IMPLANT
SUT MNCRL AB 3-0 PS2 18 (SUTURE) ×3 IMPLANT
SUT STRATAFIX 0 PDS 27 VIOLET (SUTURE) ×3
SUT VIC AB 1 CT1 36 (SUTURE) ×9 IMPLANT
SUT VIC AB 2-0 CT1 27 (SUTURE) ×6
SUT VIC AB 2-0 CT1 TAPERPNT 27 (SUTURE) ×3 IMPLANT
SUTURE STRATFX 0 PDS 27 VIOLET (SUTURE) ×1 IMPLANT
TRAY FOLEY CATH 14FR (SET/KITS/TRAYS/PACK) ×3 IMPLANT
TRI LOC BPS SZ 6 W GRIPTON ×3 IMPLANT
YANKAUER SUCT BULB TIP 10FT TU (MISCELLANEOUS) ×3 IMPLANT

## 2017-12-28 NOTE — Interval H&P Note (Signed)
History and Physical Interval Note:  12/28/2017 10:15 AM  Julie Hayden  has presented today for surgery, with the diagnosis of Left hip pain, failed open reduction interal fixation  The various methods of treatment have been discussed with the patient and family. After consideration of risks, benefits and other options for treatment, the patient has consented to  Procedure(s) with comments: Conversion of open reduction internal fixation to left total hip arthroplasty and removal of hardware (Left) - 90 mins as a surgical intervention .  The patient's history has been reviewed, patient examined, no change in status, stable for surgery.  I have reviewed the patient's chart and labs.  Questions were answered to the patient's satisfaction.     Shelda PalMatthew D Jamis Kryder

## 2017-12-28 NOTE — Anesthesia Procedure Notes (Signed)
Spinal  Patient location during procedure: OR Start time: 12/28/2017 11:28 AM End time: 12/28/2017 11:32 AM Staffing Resident/CRNA: Sharlette Dense, CRNA Performed: resident/CRNA  Preanesthetic Checklist Completed: patient identified, site marked, surgical consent, pre-op evaluation, timeout performed, IV checked, risks and benefits discussed and monitors and equipment checked Spinal Block Patient position: sitting Prep: DuraPrep Patient monitoring: heart rate, continuous pulse ox and blood pressure Approach: midline Location: L3-4 Injection technique: single-shot Needle Needle type: Sprotte  Needle gauge: 24 G Needle length: 9 cm Additional Notes Kit expiration 01/11/2019 and lot # [id number] Clear free flow CSF, negative heme, negative paresthesia Tolerated well and returned to supine position

## 2017-12-28 NOTE — Transfer of Care (Signed)
Immediate Anesthesia Transfer of Care Note  Patient: Julie Hayden  Procedure(s) Performed: Conversion of open reduction internal fixation to left total hip arthroplasty and removal of hardware (Left Hip)  Patient Location: PACU  Anesthesia Type:Spinal  Level of Consciousness: awake, alert  and oriented  Airway & Oxygen Therapy: Patient Spontanous Breathing and Patient connected to nasal cannula oxygen  Post-op Assessment: Report given to RN and Post -op Vital signs reviewed and stable  Post vital signs: Reviewed and stable  Last Vitals:  Vitals Value Taken Time  BP 122/111 12/28/2017  2:20 PM  Temp    Pulse 68 12/28/2017  2:22 PM  Resp 15 12/28/2017  2:22 PM  SpO2 96 % 12/28/2017  2:22 PM  Vitals shown include unvalidated device data.  Last Pain:  Vitals:   12/28/17 0843  TempSrc: Oral      Patients Stated Pain Goal: 3 (12/28/17 0901)  Complications: No apparent anesthesia complications

## 2017-12-28 NOTE — Brief Op Note (Signed)
12/28/2017  1:21 PM  PATIENT:  Julie Hayden  77 y.o. 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  POST-OPERATIVE DIAGNOSIS:  Left hip pain, failed open reduction interal fixation  PROCEDURE:  Procedure(s) with comments: Conversion of open reduction internal fixation to left total hip arthroplasty and removal of hardware (Left) - 90 mins  SURGEON:  Surgeon(s) and Role:    Durene Romans* Baelynn Schmuhl, MD - Primary  PHYSICIAN ASSISTANT: Skip MayerBlair Roberts, PA-C  ANESTHESIA:   spinal  EBL:  400 mL   BLOOD ADMINISTERED:none  DRAINS: none   LOCAL MEDICATIONS USED:  NONE  SPECIMEN:  No Specimen  DISPOSITION OF SPECIMEN:  N/A  COUNTS:  YES  TOURNIQUET:  * No tourniquets in log *  DICTATION: .Other Dictation: Dictation Number [id number]  PLAN OF CARE: Admit to inpatient   PATIENT DISPOSITION:  PACU - hemodynamically stable.   Delay start of Pharmacological VTE agent (>24hrs) due to surgical blood loss or risk of bleeding: no

## 2017-12-28 NOTE — Op Note (Signed)
NAME: Julie Hayden, Julie Hayden MEDICAL RECORD ZO:[id number] ACCOUNT [id number] DATE OF BIRTH:Aug 05, 1940 FACILITY: [facility] LOCATION: [facility] PHYSICIAN:Geneva Barrero DCharlann Boxer, MD  OPERATIVE REPORT  DATE OF PROCEDURE:  12/28/2017  PREOPERATIVE DIAGNOSIS:  Left hip advanced osteoarthritis the setting of left intertrochanteric femur fracture status post open reduction internal fixation about 8 months ago.  POSTOPERATIVE DIAGNOSIS:  Left hip advanced osteoarthritis the setting of left intertrochanteric femur fracture status post open reduction internal fixation about 8 months ago.  PROCEDURE:  Conversion of left hip surgery to left total hip arthroplasty with removal of a Biomet Affixus trochanteric nail including lag screw and distal interlocking screw.  Subsequently in place was a DePuy total hip arthroplasty with a size 52  Pinnacle shell 36+4 neutral Ultrex liner, size 6 high Tri-Lock stem and a 36+5 delta ceramic ball.  SURGEON:  Durene Romans, MD  ASSISTANT:  Skip Mayer, PA-C.  Note that Ms. Su Hilt was present for the entirety of the case for preoperative positioning, perioperative management of the operative extremity, general facilitation of the case and primary wound closure.  ANESTHESIA:  Spinal.  SPECIMENS:  None.  BLOOD LOSS:  400 mL  DRAINS:  None.  COMPLICATIONS:  None apparent.  INDICATIONS:  The patient is a 77 year old female who was actually scheduled to have a left total hip arthroplasty within the last year.  Unfortunately, approximately one week prior to her scheduled surgery, she fell and sustained an intertrochanteric  femur fracture.  At that time, I felt it was best that she had an intertrochanteric femur fracture treated to allow for maximal recovery for the gluteus muscles in time of need for a total hip arthroplasty.  She underwent a successful operation for her  intertrochanteric femur fracture and went on to unite the proximal femur without complication.  Nonetheless,  continued to deal with advanced left hip osteoarthritis.  By the time we got to routine healing, she was definitely ready to proceed with total  hip arthroplasty.  At this point, I felt it was safe to proceed with arthroplasty and removal of hardware.  Risks of complications in the OR related to her femur fracture, risk of infection, DVT, dislocation and need for future surgeries were all  discussed and reviewed.  Consent was obtained for benefit of pain relief.  DESCRIPTION OF PROCEDURE:  The patient was brought to the operative theater.  Once adequate anesthesia, preoperative antibiotics, Ancef administered, she was positioned supine on the Hana table.  Once adequately and safely positioned and padded,  fluoroscopy was utilized to orient the pelvis.  Prior incisions were marked on the skin.  The left lower extremity was then prepped and draped in sterile fashion.  A timeout was performed identifying the patient, the planned procedures and extremity.  At this point, I first opened up her hip capsule in routine fashion.  An incision was made 2  cm lateral to the anterior superior iliac spine and orientation of the tensor fascia lata muscle.  The fascia was exposed and then incised.  The muscle was swept laterally and retractors placed extra-articularly.  Following cauterization of the  circumflex vessels and removed pericapsular fat, a T capsulotomy was made preserving the superior and inferior leaflet.  Tag sutures were placed.  Through this exposure, there was no identifiable hardware.  For this reason, I elected to remove her nail through her previously placed incisions.  I packed off the hip and attended to the removal of hardware.  Her incisions were identified and incised.   With the  assistance of fluoroscopy, the distal interlocking screw was removed.  I then identified the lag screw, placed a wire into it and then attached the locking screwdriver to this.  This was then held in place for  orientation as I then addressed  the proximal aspect of the hip.  Through her proximal incision, the proximal aspect of the femur was palpable.  I was able to open up the area where the nail had been placed insert to insert the locking screwdriver.  I then backed out the locking bolt.   This was confirmed by the lag screw being able to be rotated.  I kept the lag screw in place but the distal interlock was removed to prevent distal migration of the component of the intramedullary nail.  I then placed a guidewire into the tip of the nail  and used the reamer to open up the proximal soft tissue and bone.  This would allow for placement of the removal bolt.  I was able to get this bolt into the proximal aspect of the nail.  Once I had this firmly in position, we backed the lag screw out  and then thankfully had no purchase on the nail to remove the nail without complication.  Once this was done, I confirmed radiographically that all was stable without complication.  We then irrigated these wounds and packed with sponges as now, we  attended to the hip arthroplasty.  Attention was now redirected back to the total hip.  The retractors were then placed intraarticularly and traction was applied to the hip.  The hip was subluxated and a neck osteotomy made from the trochanteric fossa to the neck.  Femoral head was  removed.  Traction was let off and retractors were placed around the acetabulum.  Soft tissue and osteophytes debrided as necessary.  I began reaming with a 46 mm reamer and reamed up to 51 mm.  A 52 mm Pinnacle shell was selected and under fluoroscopic  imaging impacted and was well seated.  A single cancellous screw was placed into the ilium.  The final 36+4 neutral AltrX liner was then impacted with good visualized rim fit.  Attention was now directed to the femur.  The lateral hook was placed underneath the vastus lateralis and the femur was carefully rotated 100 degrees.  Retractors were placed  medially and the leg was then extended and adducted.  A retractor was placed  over the greater trochanter and the posterior aspect of the capsule taken off the medial aspect of the trochanter.  This allowed for adequate exposure of the proximal femur.  I began with a box osteotome setting rotation.  I then broached up initially to  a size 4 broach and did a trial reduction to assess leg lengths as well as orientation of the component.  A trial reduction indicated that she was still short with a size 4 based on the prepared neck cut.  Once I did the trial reduction, I also felt  that I needed a little more offset.  Based on this, we dislocated the hip, removed the trial components and broached up to a size 6 broach which set a little bit proud of the prepared neck cut.  I thus selected the size 6 high Tri-Lock stem.  It was  impacted and sat proud of the neck as anticipated.  I retrialed with a +5 head ball.  Here I found that her leg lengths appeared to be very close to being equal and offset  improved.  Given these findings, the hip was dislocated.  The final 36+5 delta  ceramic ball was impacted on a clean and dry trunnion and the hip reduced.  The hip was irrigated throughout the case.  Due to perhaps some disuse over this period of time, her tensor fascia lata muscle was noted to be atrophied.  With the use of  retractors, this area had some damage to approximately 20% of the muscle.  I reapproximated the capsule using #1 Vicryl.  The fascia of the tensor fascia lata was then reapproximated using #1 Vicryl and a running Monocryl stitch.  The remainder of the  hip wound was closed with 2-0 Vicryl and a running Monocryl stitch.  This incision site was used to remove her femoral nail were closed with 2-0 Vicryl and running stitch.  All wounds were cleaned, dried and dressed sterilely with surgical glue and  Aquacel dressing.  She will be admitted to the hospital.  We will probably have her be limited  weightbearing for a little bit to make certain that her bone heals without complication given the potential stress riser at the distal interlock.  TN/NUANCE  D:12/28/2017 T:12/28/2017 JOB:[id number]

## 2017-12-28 NOTE — Anesthesia Preprocedure Evaluation (Signed)
Anesthesia Evaluation  Patient identified by MRN, date of birth, ID band Patient awake    Reviewed: Allergy & Precautions, NPO status , Patient's Chart, lab work & pertinent test results  Airway Mallampati: II  TM Distance: >3 FB Neck ROM: Full    Dental no notable dental hx.    Pulmonary neg pulmonary ROS, former smoker,    Pulmonary exam normal breath sounds clear to auscultation       Cardiovascular negative cardio ROS Normal cardiovascular exam Rhythm:Regular Rate:Normal     Neuro/Psych negative neurological ROS  negative psych ROS   GI/Hepatic negative GI ROS, Neg liver ROS,   Endo/Other  negative endocrine ROSHypothyroidism   Renal/GU negative Renal ROS  negative genitourinary   Musculoskeletal  (+) Arthritis , Osteoarthritis,    Abdominal   Peds negative pediatric ROS (+)  Hematology negative hematology ROS (+)   Anesthesia Other Findings   Reproductive/Obstetrics negative OB ROS                             Anesthesia Physical  Anesthesia Plan  ASA: III  Anesthesia Plan: Spinal   Post-op Pain Management:    Induction: Intravenous  PONV Risk Score and Plan: 2 and Treatment may vary due to age or medical condition, Ondansetron and Midazolam  Airway Management Planned: Simple Face Mask  Additional Equipment:   Intra-op Plan:   Post-operative Plan:   Informed Consent: I have reviewed the patients History and Physical, chart, labs and discussed the procedure including the risks, benefits and alternatives for the proposed anesthesia with the patient or authorized representative who has indicated his/her understanding and acceptance.     Plan Discussed with: CRNA, Surgeon and Anesthesiologist  Anesthesia Plan Comments: ( )        Anesthesia Quick Evaluation

## 2017-12-28 NOTE — Anesthesia Procedure Notes (Signed)
Date/Time: 12/28/2017 11:28 AM Performed by: Florene Routeeardon, Ivon Oelkers L, CRNA Oxygen Delivery Method: Simple face mask

## 2017-12-28 NOTE — Anesthesia Postprocedure Evaluation (Signed)
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 and removal of hardware (Left Hip)     Patient location during evaluation: PACU Anesthesia Type: Spinal Level of consciousness: oriented and awake and alert Pain management: pain level controlled Vital Signs Assessment: post-procedure vital signs reviewed and stable Respiratory status: spontaneous breathing and respiratory function stable Cardiovascular status: blood pressure returned to baseline and stable Postop Assessment: no headache, no backache and no apparent nausea or vomiting Anesthetic complications: no    Last Vitals:  Vitals:   12/28/17 1530 12/28/17 1545  BP: (!) 106/51 101/62  Pulse: 80 85  Resp: 13 12  Temp:    SpO2: 99% 96%    Last Pain:  Vitals:   12/28/17 1545  TempSrc:   PainSc: 2                  Lowella CurbWarren Ray Jessyca Sloan

## 2017-12-29 ENCOUNTER — Encounter (HOSPITAL_COMMUNITY): Payer: Self-pay | Admitting: Orthopedic Surgery

## 2017-12-29 DIAGNOSIS — E663 Overweight: Secondary | ICD-10-CM | POA: Diagnosis present

## 2017-12-29 LAB — BASIC METABOLIC PANEL
Anion gap: 5 (ref 5–15)
BUN: 13 mg/dL (ref 8–23)
CALCIUM: 8.5 mg/dL — AB (ref 8.9–10.3)
CHLORIDE: 109 mmol/L (ref 98–111)
CO2: 27 mmol/L (ref 22–32)
CREATININE: 0.67 mg/dL (ref 0.44–1.00)
Glucose, Bld: 119 mg/dL — ABNORMAL HIGH (ref 70–99)
Potassium: 4.6 mmol/L (ref 3.5–5.1)
SODIUM: 141 mmol/L (ref 135–145)

## 2017-12-29 LAB — CBC
HCT: 30.6 % — ABNORMAL LOW (ref 36.0–46.0)
Hemoglobin: 10.1 g/dL — ABNORMAL LOW (ref 12.0–15.0)
MCH: 31.8 pg (ref 26.0–34.0)
MCHC: 33 g/dL (ref 30.0–36.0)
MCV: 96.2 fL (ref 78.0–100.0)
PLATELETS: 317 10*3/uL (ref 150–400)
RBC: 3.18 MIL/uL — ABNORMAL LOW (ref 3.87–5.11)
RDW: 12.8 % (ref 11.5–15.5)
WBC: 11.3 10*3/uL — ABNORMAL HIGH (ref 4.0–10.5)

## 2017-12-29 NOTE — Progress Notes (Signed)
Physical Therapy Treatment Patient Details Name: Julie Hayden MRN: [id number] DOB: January 29, 1941 Today's Date: 12/29/2017    History of Present Illness L hip ORIF converstion  to DA-THA    PT Comments    Pt is progressing well with mobility, she ambulated 160' with RW and performed THA exercises with supervision. Will plan to do stair training tomorrow morning, then expect she will be ready to DC home from PT standpoint.    Follow Up Recommendations  Supervision for mobility/OOB;Follow surgeon's recommendation for DC plan and follow-up therapies     Equipment Recommendations  None recommended by PT    Recommendations for Other Services       Precautions / Restrictions Precautions Precautions: Fall Restrictions Weight Bearing Restrictions: Yes LLE Weight Bearing: Partial weight bearing LLE Partial Weight Bearing Percentage or Pounds: 50%    Mobility  Bed Mobility Overal bed mobility: Modified Independent             General bed mobility comments: up in recliner  Transfers Overall transfer level: Needs assistance Equipment used: Rolling walker (2 wheeled) Transfers: Sit to/from Stand Sit to Stand: Supervision         General transfer comment: VCs hand placement  Ambulation/Gait Ambulation/Gait assistance: Min guard Gait Distance (Feet): 160 Feet Assistive device: Rolling walker (2 wheeled) Gait Pattern/deviations: Step-to pattern;Decreased stride length Gait velocity: decr   General Gait Details: VCs sequencing, no loss of balance   Stairs             Wheelchair Mobility    Modified Rankin (Stroke Patients Only)       Balance Overall balance assessment: Modified Independent                                          Cognition Arousal/Alertness: Awake/alert Behavior During Therapy: WFL for tasks assessed/performed Overall Cognitive Status: Within Functional Limits for tasks assessed                                        Exercises Total Joint Exercises Ankle Circles/Pumps: AROM;Both;10 reps;Supine Quad Sets: AROM;Both;10 reps;Supine Heel Slides: AROM;Left;10 reps;Supine Hip ABduction/ADduction: AROM;Left;10 reps;Supine Long Arc Quad: AROM;Left;Seated;10 reps    General Comments        Pertinent Vitals/Pain Pain Assessment: 0-10 Pain Score: 3  Pain Location: L hip Pain Descriptors / Indicators: Aching Pain Intervention(s): Limited activity within patient's tolerance;Monitored during session;Premedicated before session;Ice applied    Home Living Family/patient expects to be discharged to:: Private residence Living Arrangements: Children Available Help at Discharge: Family;Available 24 hours/day Type of Home: House Home Access: Stairs to enter Entrance Stairs-Rails: None Home Layout: Two level;Bed/bath upstairs Home Equipment: Walker - 2 wheels;Bedside commode;Shower seat;Crutches      Prior Function Level of Independence: Independent          PT Goals (current goals can now be found in the care plan section) Acute Rehab PT Goals Patient Stated Goal: weekend road trips, crafts PT Goal Formulation: With patient Time For Goal Achievement: 01/05/18 Potential to Achieve Goals: Good Progress towards PT goals: Progressing toward goals    Frequency    7X/week      PT Plan Current plan remains appropriate    Co-evaluation              AM-PAC PT "  6 Clicks" Daily Activity  Outcome Measure  Difficulty turning over in bed (including adjusting bedclothes, sheets and blankets)?: A Little Difficulty moving from lying on back to sitting on the side of the bed? : A Little Difficulty sitting down on and standing up from a chair with arms (e.g., wheelchair, bedside commode, etc,.)?: A Little Help needed moving to and from a bed to chair (including a wheelchair)?: A Little Help needed walking in hospital room?: A Little Help needed climbing 3-5 steps with a railing? :  A Lot 6 Click Score: 17    End of Session Equipment Utilized During Treatment: Gait belt Activity Tolerance: Patient tolerated treatment well Patient left: in chair;with call bell/phone within reach;with family/visitor present Nurse Communication: Mobility status PT Visit Diagnosis: Difficulty in walking, not elsewhere classified (R26.2);Muscle weakness (generalized) (M62.81);Pain Pain - Right/Left: Left Pain - part of body: Hip     Time: [id number] PT Time Calculation (min) (ACUTE ONLY): 27 min  Charges:  $Gait Training: 8-22 mins $Therapeutic Exercise: 8-22 mins                    Ralene BatheUhlenberg, Analyce Tavares Kistler PT 12/29/2017  Acute Rehabilitation Services Pager [phone] Office [phone]

## 2017-12-29 NOTE — Progress Notes (Signed)
     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  No reported events throughout the night.  We have discussed her PWB status and the use of a walker, which she already has.     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   Weight as of this encounter: 65.2 kg. Patient also counseled that weight may inhibit the healing process Patient counseled that losing weight will help with future health issues      Anastasio AuerbachMatthew S. Ruston Fedora   PAC  12/29/2017, 7:42 AM

## 2017-12-29 NOTE — Evaluation (Signed)
Physical Therapy Evaluation Patient Details Name: Julie Hayden MRN: [id number] DOB: 19-Dec-1940 Today's Date: 12/29/2017   History of Present Illness  L hip ORIF converstion  to DA-THA  Clinical Impression  Pt is s/p THA resulting in the deficits listed below (see PT Problem List). Pt doing well with mobility, she ambulated 120' with RW with min/guard assist and performed THA exercises without assistance. She adheres to 50% PWB well. Excellent progress expected.  Pt will benefit from skilled PT to increase their independence and safety with mobility to allow discharge to the venue listed below.      Follow Up Recommendations Supervision for mobility/OOB;Follow surgeon's recommendation for DC plan and follow-up therapies    Equipment Recommendations  None recommended by PT    Recommendations for Other Services       Precautions / Restrictions Precautions Precautions: Fall Restrictions Weight Bearing Restrictions: Yes LLE Weight Bearing: Partial weight bearing LLE Partial Weight Bearing Percentage or Pounds: 50%      Mobility  Bed Mobility Overal bed mobility: Modified Independent             General bed mobility comments: HOB up, used rail  Transfers Overall transfer level: Needs assistance Equipment used: Rolling walker (2 wheeled) Transfers: Sit to/from Stand Sit to Stand: Min guard         General transfer comment: VCs hand placement  Ambulation/Gait Ambulation/Gait assistance: Min guard Gait Distance (Feet): 120 Feet Assistive device: Rolling walker (2 wheeled) Gait Pattern/deviations: Step-to pattern;Decreased stride length Gait velocity: decr   General Gait Details: VCs sequencing, no loss of balance  Stairs            Wheelchair Mobility    Modified Rankin (Stroke Patients Only)       Balance Overall balance assessment: Modified Independent                                           Pertinent Vitals/Pain Pain  Assessment: 0-10 Pain Score: 2  Pain Location: L hip Pain Descriptors / Indicators: Aching Pain Intervention(s): Limited activity within patient's tolerance;Monitored during session;Premedicated before session;Ice applied    Home Living Family/patient expects to be discharged to:: Private residence Living Arrangements: Children Available Help at Discharge: Family;Available 24 hours/day Type of Home: House Home Access: Stairs to enter Entrance Stairs-Rails: None Entrance Stairs-Number of Steps: 3 Home Layout: Two level;Bed/bath upstairs Home Equipment: Walker - 2 wheels;Bedside commode;Shower seat;Crutches      Prior Function Level of Independence: Independent               Hand Dominance        Extremity/Trunk Assessment   Upper Extremity Assessment Upper Extremity Assessment: Overall WFL for tasks assessed    Lower Extremity Assessment Lower Extremity Assessment: LLE deficits/detail LLE Deficits / Details: knee ext -3/5, hip 3/5 LLE Sensation: WNL    Cervical / Trunk Assessment Cervical / Trunk Assessment: Normal  Communication   Communication: No difficulties  Cognition Arousal/Alertness: Awake/alert Behavior During Therapy: WFL for tasks assessed/performed Overall Cognitive Status: Within Functional Limits for tasks assessed                                        General Comments      Exercises Total Joint Exercises Ankle Circles/Pumps: AROM;Both;10 reps;Supine Heel  Slides: AROM;Left;10 reps;Supine Hip ABduction/ADduction: AROM;Left;10 reps;Supine Long Arc Quad: AROM;Left;5 reps;Seated   Assessment/Plan    PT Assessment Patient needs continued PT services  PT Problem List Decreased strength;Decreased activity tolerance;Decreased mobility;Pain;Decreased knowledge of use of DME       PT Treatment Interventions Gait training;Therapeutic activities;Stair training;Therapeutic exercise;DME instruction;Patient/family education    PT  Goals (Current goals can be found in the Care Plan section)  Acute Rehab PT Goals Patient Stated Goal: weekend road trips, crafts PT Goal Formulation: With patient Time For Goal Achievement: 01/05/18 Potential to Achieve Goals: Good    Frequency 7X/week   Barriers to discharge        Co-evaluation               AM-PAC PT "6 Clicks" Daily Activity  Outcome Measure Difficulty turning over in bed (including adjusting bedclothes, sheets and blankets)?: A Little Difficulty moving from lying on back to sitting on the side of the bed? : A Little Difficulty sitting down on and standing up from a chair with arms (e.g., wheelchair, bedside commode, etc,.)?: A Little Help needed moving to and from a bed to chair (including a wheelchair)?: A Little Help needed walking in hospital room?: A Little Help needed climbing 3-5 steps with a railing? : A Lot 6 Click Score: 17    End of Session Equipment Utilized During Treatment: Gait belt Activity Tolerance: Patient tolerated treatment well Patient left: in chair;with call bell/phone within reach Nurse Communication: Mobility status PT Visit Diagnosis: Difficulty in walking, not elsewhere classified (R26.2);Muscle weakness (generalized) (M62.81);Pain Pain - Right/Left: Left Pain - part of body: Hip    Time: [id number] PT Time Calculation (min) (ACUTE ONLY): 33 min   Charges:   PT Evaluation $PT Eval Low Complexity: 1 Low PT Treatments $Gait Training: 8-22 mins        Ralene BatheUhlenberg, Sydnee Lamour Kistler PT 12/29/2017  Acute Rehabilitation Services Pager [phone] Office [phone]

## 2017-12-30 LAB — BASIC METABOLIC PANEL
Anion gap: 5 (ref 5–15)
BUN: 14 mg/dL (ref 8–23)
CALCIUM: 8.3 mg/dL — AB (ref 8.9–10.3)
CO2: 28 mmol/L (ref 22–32)
CREATININE: 0.73 mg/dL (ref 0.44–1.00)
Chloride: 110 mmol/L (ref 98–111)
Glucose, Bld: 112 mg/dL — ABNORMAL HIGH (ref 70–99)
Potassium: 4.1 mmol/L (ref 3.5–5.1)
SODIUM: 143 mmol/L (ref 135–145)

## 2017-12-30 LAB — CBC
HEMATOCRIT: 28 % — AB (ref 36.0–46.0)
Hemoglobin: 9.4 g/dL — ABNORMAL LOW (ref 12.0–15.0)
MCH: 32.3 pg (ref 26.0–34.0)
MCHC: 33.6 g/dL (ref 30.0–36.0)
MCV: 96.2 fL (ref 78.0–100.0)
PLATELETS: 306 10*3/uL (ref 150–400)
RBC: 2.91 MIL/uL — ABNORMAL LOW (ref 3.87–5.11)
RDW: 13 % (ref 11.5–15.5)
WBC: 10.4 10*3/uL (ref 4.0–10.5)

## 2017-12-30 NOTE — Progress Notes (Signed)
Physical Therapy Treatment Patient Details Name: Julie Hayden MRN: [id number] DOB: [birth year] Today's Date: 12/30/2017    History of Present Illness L hip ORIF converstion  to DA-THA    PT Comments    Pt ambulated 180' with RW, reviewed HEP, completed stair training. PT goals met, she is ready to DC home from PT standpoint.   Follow Up Recommendations  Supervision for mobility/OOB;Follow surgeon's recommendation for DC plan and follow-up therapies     Equipment Recommendations  None recommended by PT    Recommendations for Other Services       Precautions / Restrictions Precautions Precautions: Fall Restrictions Weight Bearing Restrictions: Yes LLE Weight Bearing: Partial weight bearing LLE Partial Weight Bearing Percentage or Pounds: 50%    Mobility  Bed Mobility               General bed mobility comments: up in recliner  Transfers Overall transfer level: Modified independent Equipment used: Rolling walker (2 wheeled) Transfers: Sit to/from Stand Sit to Stand: Modified independent (Device/Increase time)            Ambulation/Gait Ambulation/Gait assistance: Supervision Gait Distance (Feet): 180 Feet Assistive device: Rolling walker (2 wheeled) Gait Pattern/deviations: Step-to pattern;Decreased stride length Gait velocity: decr   General Gait Details: VCs to roll rather than lift RW, no loss of balance, good sequencing and adherence to PWB status   Stairs Stairs: Yes Stairs assistance: Min assist Stair Management: No rails;Backwards;Step to pattern;With walker Number of Stairs: 3 General stair comments: 3 steps with no rails with RW with min A to manage RW, then 5 steps with R rail and crutch with min/guard assist, VCs sequencing   Wheelchair Mobility    Modified Rankin (Stroke Patients Only)       Balance Overall balance assessment: Modified Independent                                          Cognition  Arousal/Alertness: Awake/alert Behavior During Therapy: WFL for tasks assessed/performed Overall Cognitive Status: Within Functional Limits for tasks assessed                                        Exercises Total Joint Exercises Ankle Circles/Pumps: AROM;Both;10 reps;Supine Quad Sets: AROM;Both;10 reps;Supine Short Arc Quad: AROM;Left;10 reps;Supine Heel Slides: AROM;Left;10 reps;Supine Hip ABduction/ADduction: AROM;Left;10 reps;Supine(also 10 x LLE standing) Knee Flexion: AROM;Left;10 reps;Standing Marching in Standing: AROM;Left;10 reps;Standing Standing Hip Extension: AROM;Left;10 reps;Standing    General Comments        Pertinent Vitals/Pain Pain Score: 3  Pain Location: L hip Pain Descriptors / Indicators: Aching Pain Intervention(s): Limited activity within patient's tolerance;Monitored during session;Premedicated before session;Ice applied    Home Living                      Prior Function            PT Goals (current goals can now be found in the care plan section) Acute Rehab PT Goals Patient Stated Goal: weekend road trips, crafts PT Goal Formulation: All assessment and education complete, DC therapy Time For Goal Achievement: 01/05/18 Potential to Achieve Goals: Good Progress towards PT goals: Goals met/education completed, patient discharged from PT    Frequency    7X/week      PT Plan  Current plan remains appropriate    Co-evaluation              AM-PAC PT "6 Clicks" Daily Activity  Outcome Measure  Difficulty turning over in bed (including adjusting bedclothes, sheets and blankets)?: None Difficulty moving from lying on back to sitting on the side of the bed? : None Difficulty sitting down on and standing up from a chair with arms (e.g., wheelchair, bedside commode, etc,.)?: A Little Help needed moving to and from a bed to chair (including a wheelchair)?: None Help needed walking in hospital room?: None Help  needed climbing 3-5 steps with a railing? : A Little 6 Click Score: 22    End of Session Equipment Utilized During Treatment: Gait belt Activity Tolerance: Patient tolerated treatment well Patient left: in chair;with call bell/phone within reach Nurse Communication: Mobility status PT Visit Diagnosis: Difficulty in walking, not elsewhere classified (R26.2);Muscle weakness (generalized) (M62.81);Pain Pain - Right/Left: Left Pain - part of body: Hip     Time: 0927-0959 PT Time Calculation (min) (ACUTE ONLY): 32 min  Charges:  $Gait Training: 8-22 mins $Therapeutic Exercise: 8-22 mins                     Blondell Reveal Kistler PT 12/30/2017  Acute Rehabilitation Services Pager [phone] Office [phone]

## 2017-12-30 NOTE — Progress Notes (Signed)
RN reviewed discharge instructions with patient. All questions answered.   Paperwork and prescriptions given.   NT rolled patient down with all belongings to family car.  

## 2017-12-30 NOTE — Progress Notes (Signed)
     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 No events throughout the night. She feels better than she expected to. Ready to be discharged home.    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 Follow up with OLIN,Pearlean Sabina D in 2 weeks.  Contact information:  [facility]) [address], [address] [place] [id number] [phone]        Anastasio AuerbachMatthew S. Maragret Vanacker   PAC  12/30/2017, 9:53 AM

## 2018-01-04 NOTE — Discharge Summary (Signed)
Physician Discharge Summary  Patient ID: Julie Hayden MRN: [id number] DOB/AGE: 14-Jan-1941 77 y.o.  Admit date: 12/28/2017 Discharge date: 12/30/2017   Procedures:  Procedure(s) (LRB): Conversion of open reduction internal fixation to left total hip arthroplasty and removal of hardware (Left)  Attending Physician:  Dr. Durene Romans   Admission Diagnoses:    Left hip OA / pain with retained orthopaedic hardware  Discharge Diagnoses:  Principal Problem:   S/P left THA Active Problems:   Overweight (BMI 25.0-29.9)  Past Medical History:  Diagnosis Date  . Anxiety   . Hip fracture, unspecified laterality, closed, initial encounter (HCC) 05/26/2017  . Osteoarthritis 05/26/2017  . Osteoporosis   . Thyroid disease    goiter    HPI:    Julie Hayden, 77 y.o. female, has a history of pain and functional disability in the left hip(s) due to trauma and arthritis and patient has failed non-surgical conservative treatments for greater than 12 weeks to include NSAID's and/or analgesics, use of assistive devices and activity modification.  Onset of symptoms was gradual starting 1+ years ago with gradually worsening course since that time.The patient noted prior procedures of the hip to include ORIF on the left hip(s).  Patient currently rates pain in the left hip at 9 out of 10 with activity. Patient has worsening of pain with activity and weight bearing, trendelenberg gait, pain that interfers with activities of daily living and pain with passive range of motion. Patient has evidence of periarticular osteophytes, joint space narrowing and previous ORIF by imaging studies. This condition presents safety issues increasing the risk of falls. There is no current active infection.  Risks, benefits and expectations were discussed with the patient.  Risks including but not limited to the risk of anesthesia, blood clots, nerve damage, blood vessel damage, failure of the prosthesis, infection and up to  and including death.  Patient understand the risks, benefits and expectations and wishes to proceed with surgery.   PCP: Cheron Schaumann., MD   Discharged Condition: good  Hospital Course:  Patient underwent the above stated procedure on 12/28/2017. Patient tolerated the procedure well and brought to the recovery room in good condition and subsequently to the floor.  POD #1 BP: 98/64 ; Pulse: 65 ; Temp: 98.2 F (36.8 C) ; Resp: 16 Patient reports pain as mild/moderate, depending on activity.  No reported events throughout the night.  We have discussed her PWB status and the use of a walker, which she already has.  Dorsiflexion/plantar flexion intact, incision: dressing C/D/I, no cellulitis present and compartment soft.   LABS  Basename    HGB     10.1  HCT     30.6   POD #2  BP: 105/53 ; Pulse: 72 ; Temp: 99.2 F (37.3 C) ; Resp: 16 Patient reports pain as mild, pain controlled. No events throughout the night. She feels better than she expected to. Ready to be discharged home.  Dorsiflexion/plantar flexion intact, incision: dressing C/D/I, no cellulitis present and compartment soft.   LABS  Basename    HGB     9.4  HCT     28.0    Discharge Exam: General appearance: alert, cooperative and no distress Extremities: Homans sign is negative, no sign of DVT, no edema, redness or tenderness in the calves or thighs and no ulcers, gangrene or trophic changes  Disposition:  Home with follow up in 2 weeks   Follow-up Information    Durene Romans, MD. Schedule an  appointment as soon as possible for a visit in 2 weeks.   Specialty:  Orthopedic Surgery Contact information: [address] [place] [address] [phone]           Discharge Instructions    Call MD / Call 911   Complete by:  As directed    If you experience chest pain or shortness of breath, CALL 911 and be transported to the hospital emergency room.  If you develope a fever above 101 F, pus  (white drainage) or increased drainage or redness at the wound, or calf pain, call your surgeon's office.   Change dressing   Complete by:  As directed    Maintain surgical dressing until follow up in the clinic. If the edges start to pull up, may reinforce with tape. If the dressing is no longer working, may remove and cover with gauze and tape, but must keep the area dry and clean.  Call with any questions or concerns.   Constipation Prevention   Complete by:  As directed    Drink plenty of fluids.  Prune juice may be helpful.  You may use a stool softener, such as Colace (over the counter) 100 mg twice a day.  Use MiraLax (over the counter) for constipation as needed.   Diet - low sodium heart healthy   Complete by:  As directed    Discharge instructions   Complete by:  As directed    Maintain surgical dressing until follow up in the clinic. If the edges start to pull up, may reinforce with tape. If the dressing is no longer working, may remove and cover with gauze and tape, but must keep the area dry and clean.  Follow up in 2 weeks at [facility]. Call with any questions or concerns.   Increase activity slowly as tolerated   Complete by:  As directed    Weight bearing as tolerated with assist device (walker, cane, etc) as directed, use it as long as suggested by your surgeon or therapist, typically at least 4-6 weeks.   TED hose   Complete by:  As directed    Use stockings (TED hose) for 2 weeks on both leg(s).  You may remove them at night for sleeping.      Allergies as of 12/30/2017   No Known Allergies     Medication List    TAKE these medications   aspirin 81 MG chewable tablet Chew 1 tablet (81 mg total) by mouth 2 (two) times daily. Take for 4 weeks, then resume regular dose.   CALCIUM PO Take 1 tablet by mouth daily.   cholecalciferol 1000 units tablet Commonly known as:  VITAMIN D Take 4,000 Units by mouth daily.   docusate sodium 100 MG  capsule Commonly known as:  COLACE Take 1 capsule (100 mg total) by mouth 2 (two) times daily.   ferrous sulfate 325 (65 FE) MG tablet Take 1 tablet (325 mg total) by mouth 3 (three) times daily with meals.   HYDROcodone-acetaminophen 7.5-325 MG tablet Commonly known as:  NORCO Take 1-2 tablets by mouth every 4 (four) hours as needed for moderate pain.   methocarbamol 500 MG tablet Commonly known as:  ROBAXIN Take 1 tablet (500 mg total) by mouth every 6 (six) hours as needed for muscle spasms.   multivitamin with minerals Tabs tablet Take 1 tablet by mouth daily.   polyethylene glycol packet Commonly known as:  MIRALAX / GLYCOLAX Take 17 g by mouth 2 (  two) times daily.   sertraline 50 MG tablet Commonly known as:  ZOLOFT Take 100 mg by mouth daily.            Discharge Care Instructions  (From admission, onward)         Start     Ordered   12/29/17 0000  Change dressing    Comments:  Maintain surgical dressing until follow up in the clinic. If the edges start to pull up, may reinforce with tape. If the dressing is no longer working, may remove and cover with gauze and tape, but must keep the area dry and clean.  Call with any questions or concerns.   12/29/17 [id number]           Signed: Anastasio AuerbachMatthew S. Tyla Burgner   PA-C  01/04/2018, 3:58 PM

## 2019-10-08 IMAGING — CR DG LUMBAR SPINE 2-3V
3 series · 3 of 3 positions shown · non-contrast
Comparison: None.

CLINICAL DATA: Status post fall, left hip pain, back pain

EXAM:
LUMBAR SPINE - 2-3 VIEW

[t lumbar spine ap]
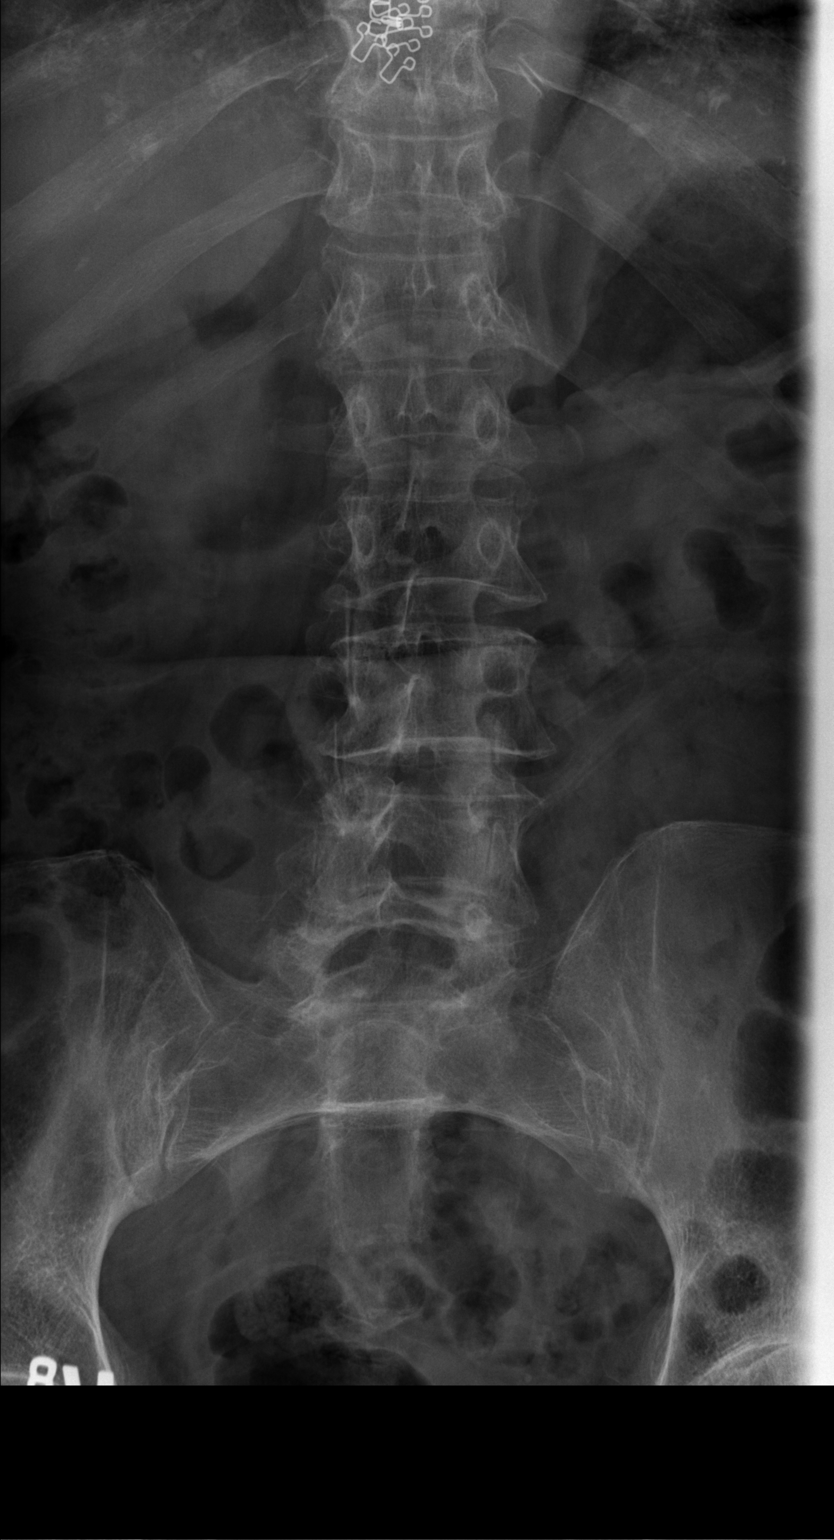

[w lumbar spine lat (1 of 2)]
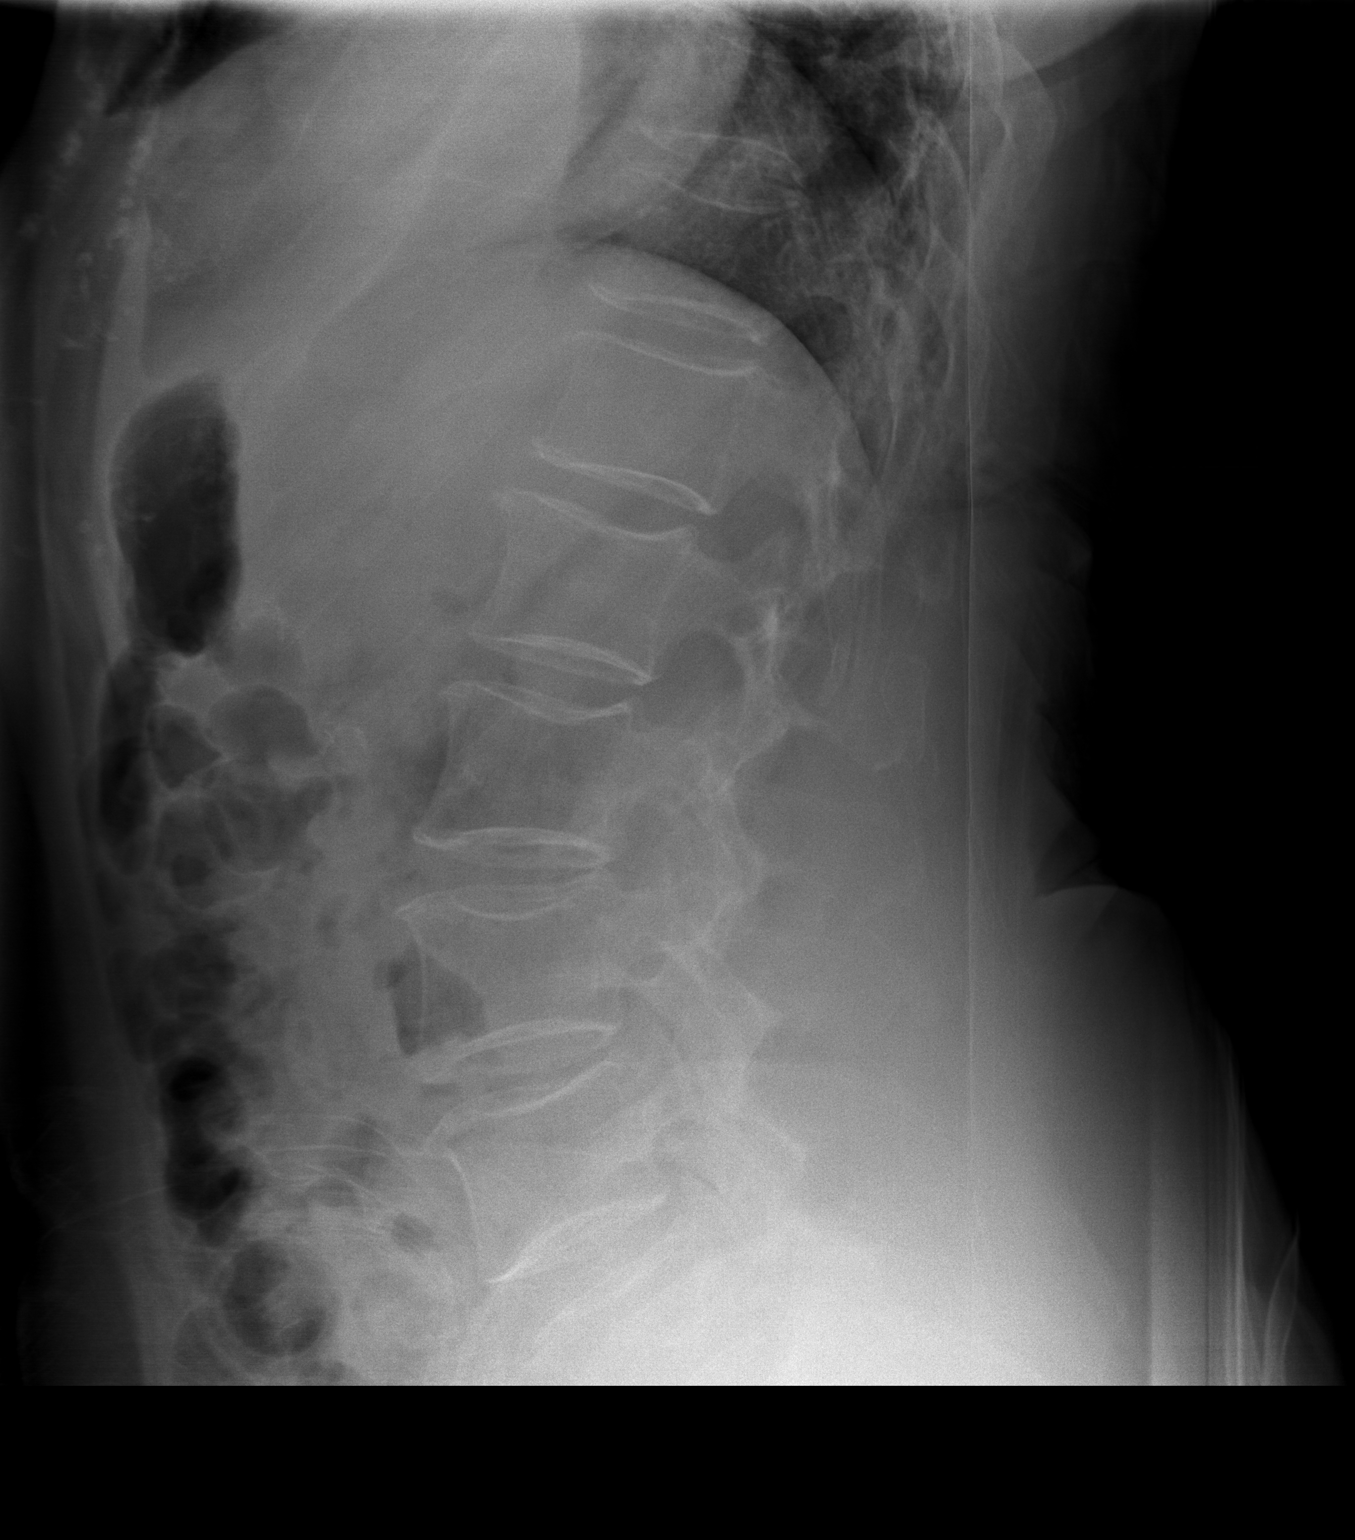

[w lumbar spine lat (2 of 2)]
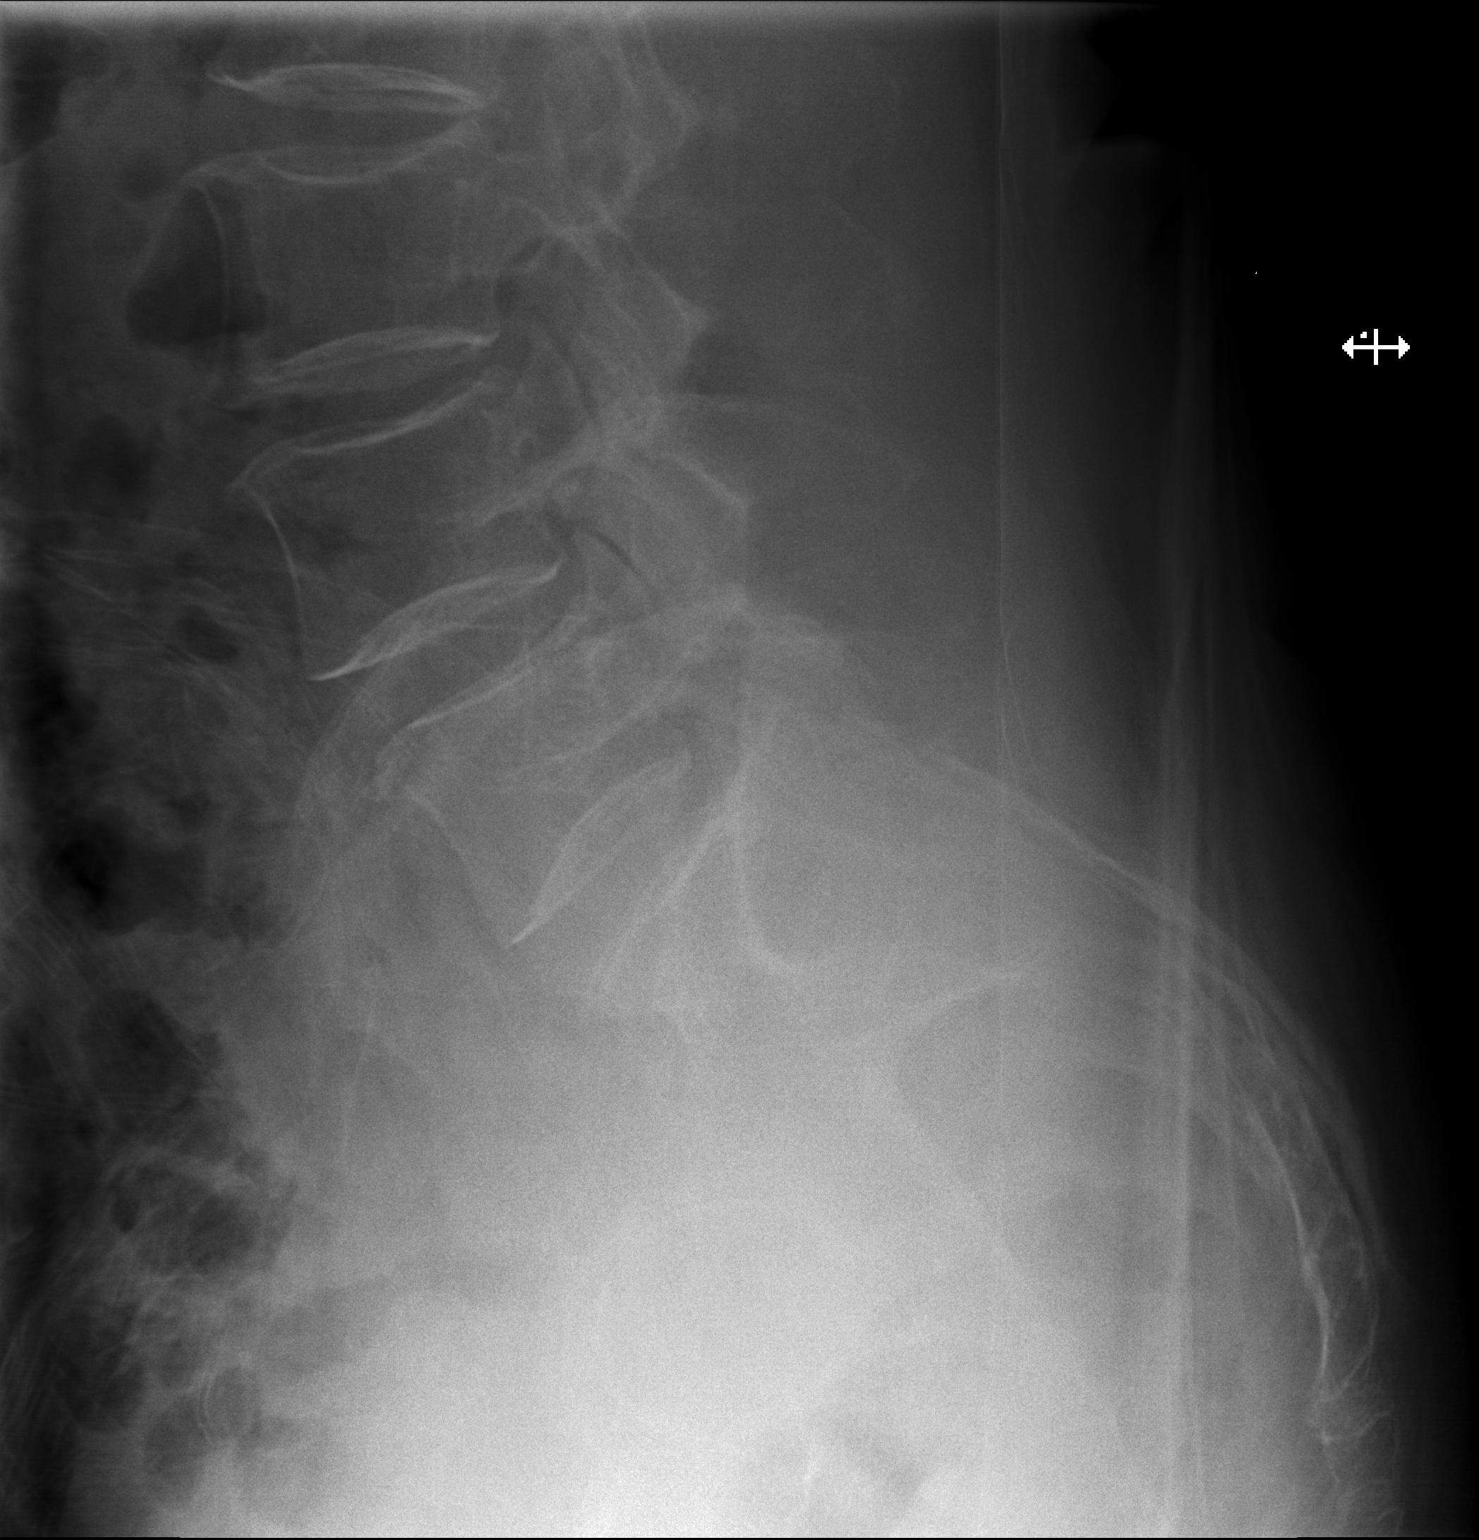

[3 of 3 positions shown; findings below may reference images not displayed]

FINDINGS: There are 5 nonrib bearing lumbar-type vertebral bodies.

The vertebral body heights are maintained. There is generalized
osteopenia.

The alignment is anatomic. There is no static listhesis. There is no
spondylolysis.

There is no acute fracture.

The disc spaces are relatively well maintained. There is bilateral
facet arthropathy at L5-S1.

The SI joints are unremarkable.
IMPRESSION: No acute osseous injury of the lumbar spine.

## 2019-10-09 IMAGING — DX DG CHEST 2V
2 series · 2 of 2 positions shown · non-contrast
Comparison: None in PACs

CLINICAL DATA: Preoperative left intramedullary nailing for hip
fracture.

EXAM:
CHEST  2 VIEW

[chest lat]
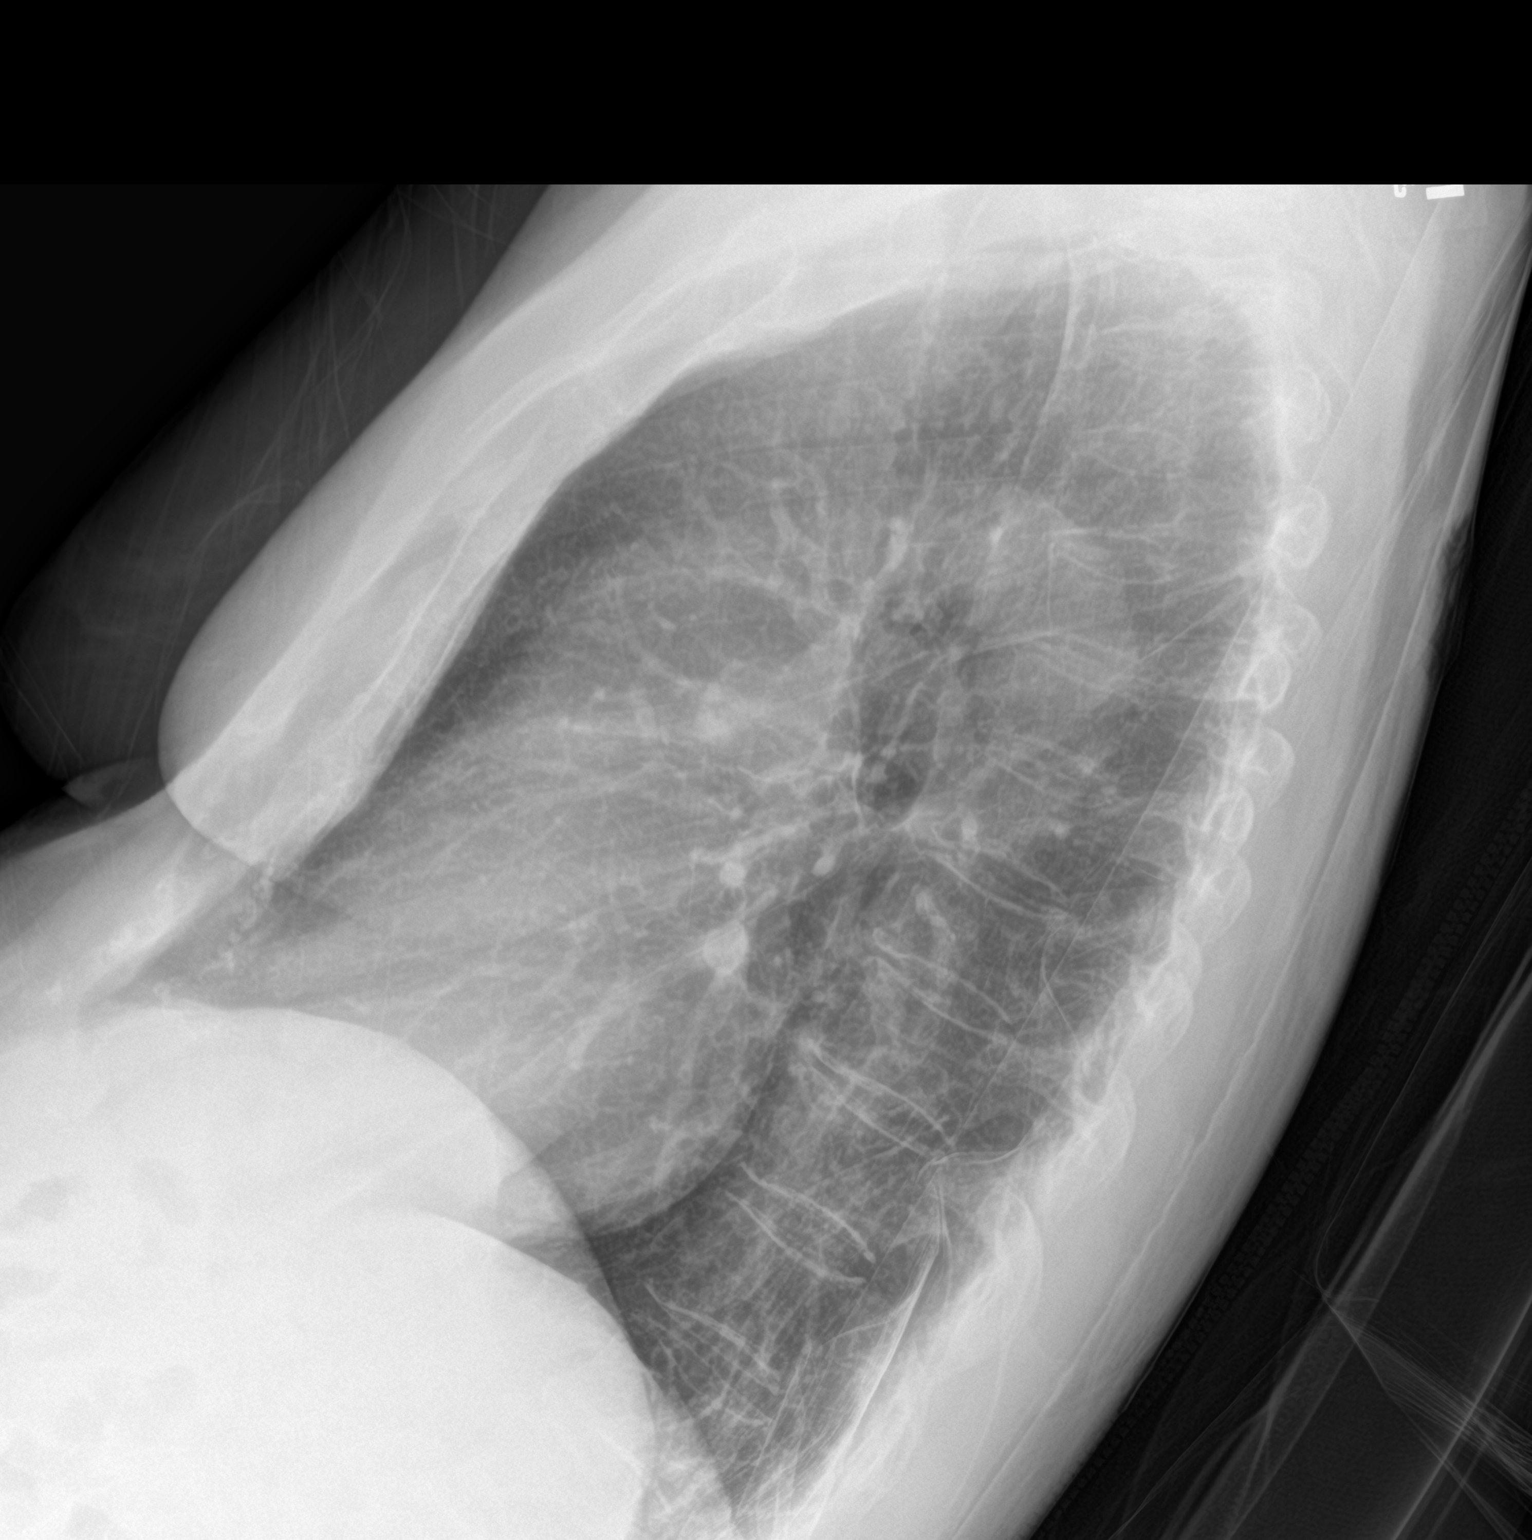

[chest ap]
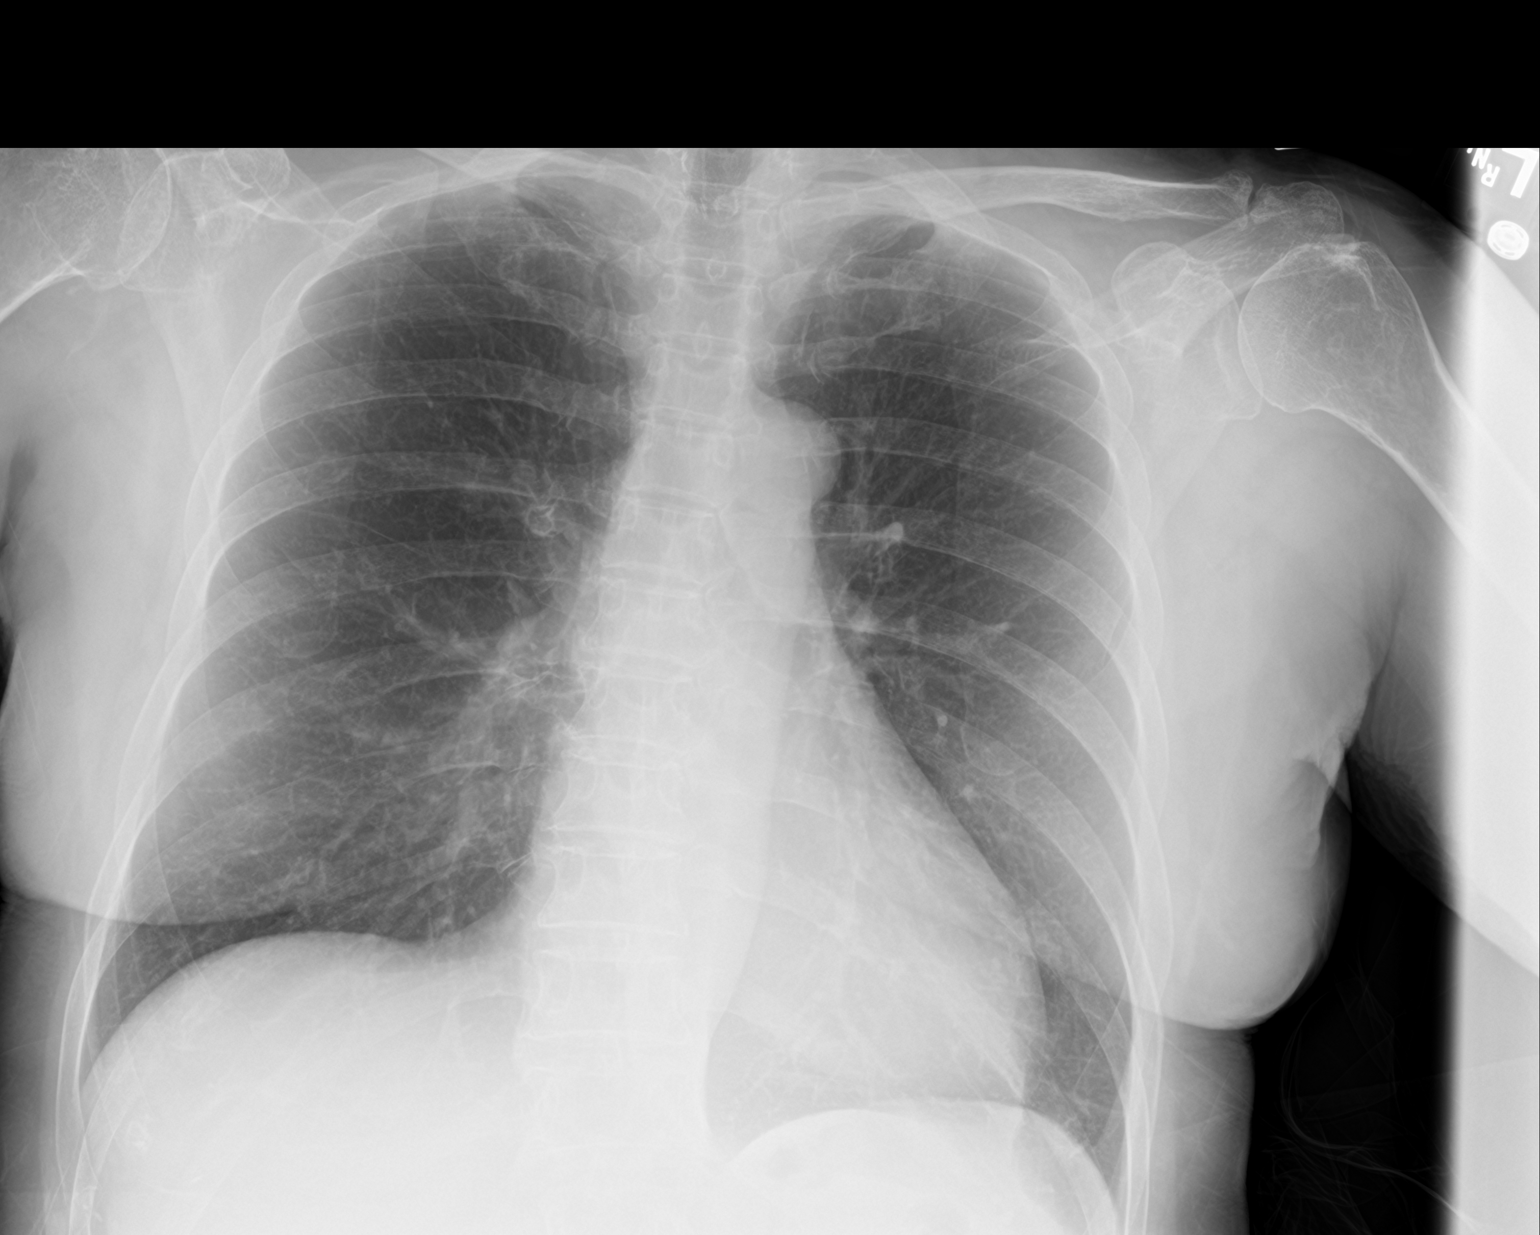

[2 of 2 positions shown; findings below may reference images not displayed]

FINDINGS: The lungs are well-expanded and clear. The heart and pulmonary
vascularity are normal. The posterior costophrenic angles are
excluded from the study. No large pleural effusion is observed. The
mediastinum is normal in width. There are degenerative changes of
both shoulders.
IMPRESSION: There is no active cardiopulmonary disease.

## 2020-05-11 IMAGING — DX DG PORTABLE PELVIS
1 series · 1 of 1 positions shown · non-contrast
Comparison: Fluoroscopy 05/28/2017

CLINICAL DATA: Hip replacement

EXAM:
PORTABLE PELVIS 1-2 VIEWS

[pelvis ap]
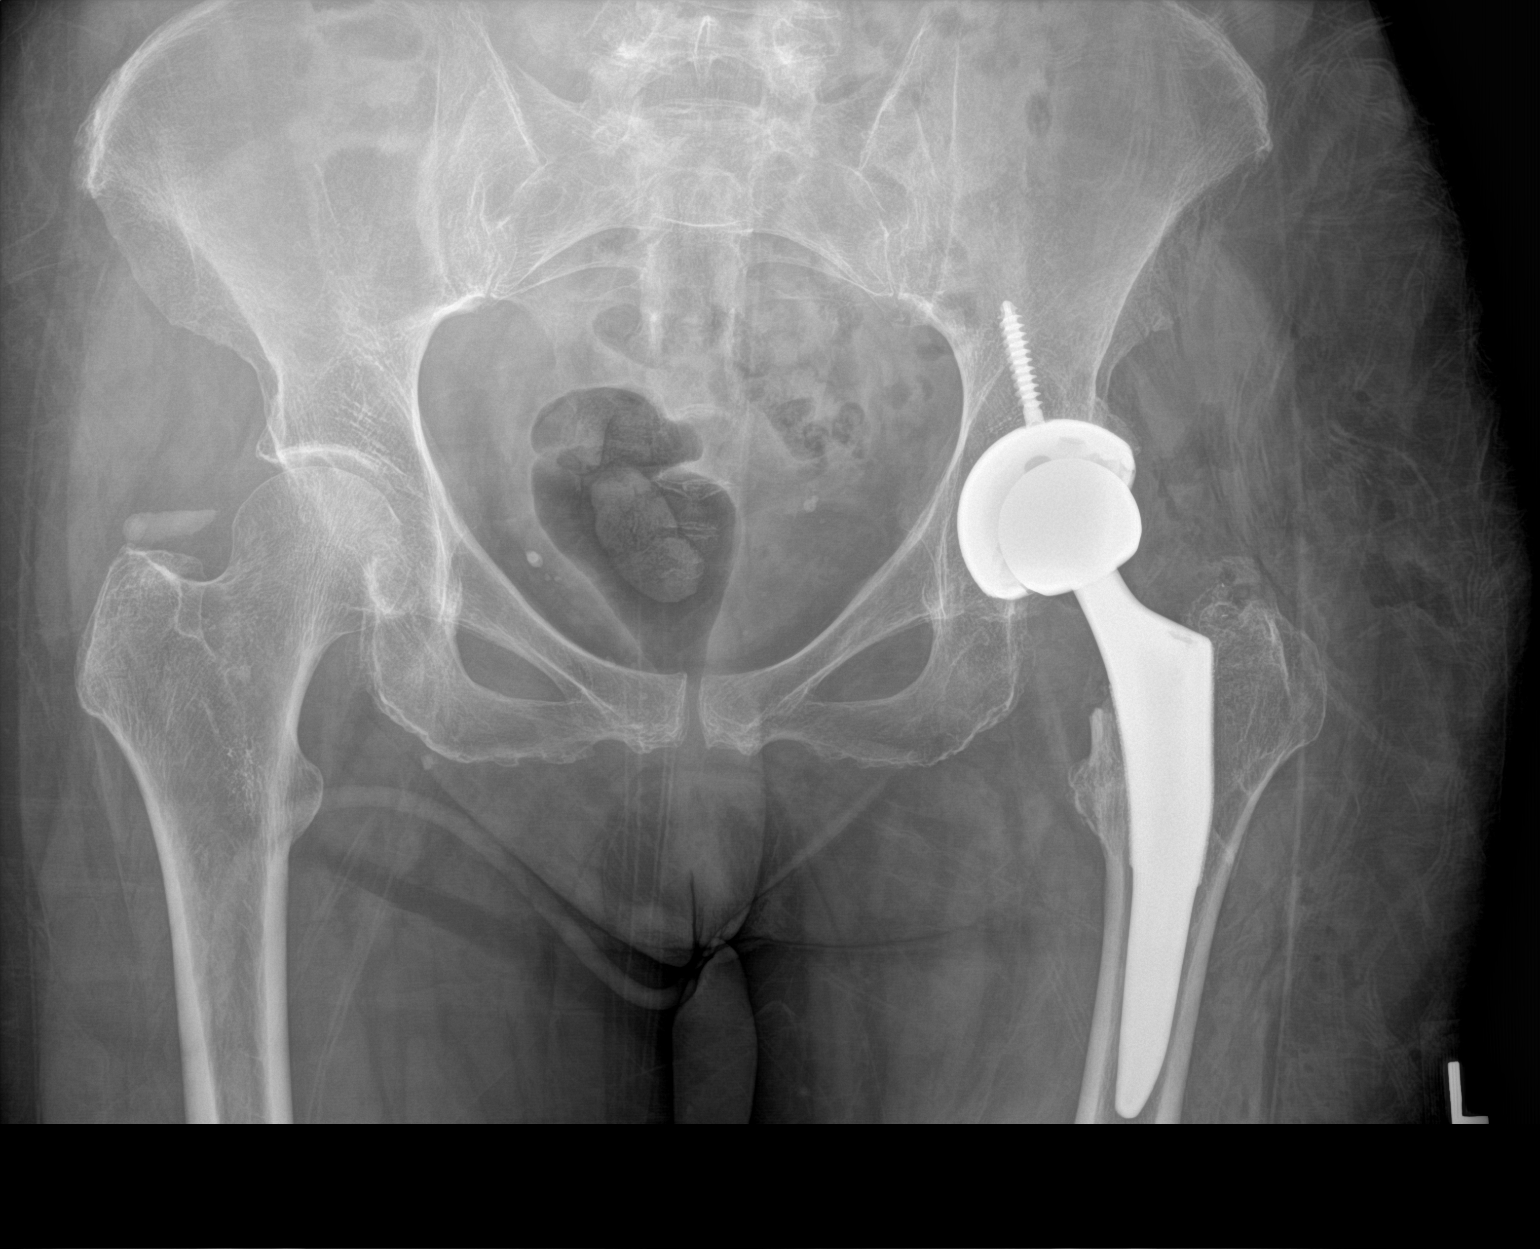

[1 of 1 positions shown; findings below may reference images not displayed]

FINDINGS: Interval removal of left femur hardware with total left hip
arthroplasty. No evident fracture. The prosthesis is located in the
AP projection.

Chronic calcification closely overlapping the right greater
trochanter.
IMPRESSION: Total left hip arthroplasty without acute finding.
